# Patient Record
Sex: Female | Born: 1935 | Race: White | Hispanic: No | Marital: Married | Smoking: Never smoker
Health system: Southern US, Community
[De-identification: ages and names within clinical notes are randomized; demographics above are authoritative.]

## PROBLEM LIST (undated history)

## (undated) DIAGNOSIS — I4891 Unspecified atrial fibrillation: Secondary | ICD-10-CM

## (undated) DIAGNOSIS — K649 Unspecified hemorrhoids: Secondary | ICD-10-CM

## (undated) DIAGNOSIS — C801 Malignant (primary) neoplasm, unspecified: Secondary | ICD-10-CM

## (undated) DIAGNOSIS — I639 Cerebral infarction, unspecified: Secondary | ICD-10-CM

## (undated) DIAGNOSIS — I10 Essential (primary) hypertension: Secondary | ICD-10-CM

## (undated) HISTORY — PX: COLONOSCOPY: SHX174

## (undated) HISTORY — PX: HEMORRHOID SURGERY: SHX153

---

## 2001-09-03 ENCOUNTER — Emergency Department (HOSPITAL_COMMUNITY): Admission: EM | Admit: 2001-09-03 | Discharge: 2001-09-03 | Payer: Self-pay | Admitting: *Deleted

## 2004-07-25 ENCOUNTER — Encounter: Admission: RE | Admit: 2004-07-25 | Discharge: 2004-07-25 | Payer: Self-pay | Admitting: Diagnostic Radiology

## 2011-10-02 DIAGNOSIS — I639 Cerebral infarction, unspecified: Secondary | ICD-10-CM

## 2011-10-02 HISTORY — DX: Cerebral infarction, unspecified: I63.9

## 2012-09-03 HISTORY — PX: RECTAL BIOPSY: SHX2303

## 2012-09-04 ENCOUNTER — Other Ambulatory Visit: Payer: Self-pay | Admitting: *Deleted

## 2012-09-04 ENCOUNTER — Other Ambulatory Visit: Payer: Self-pay | Admitting: Gastroenterology

## 2012-09-04 ENCOUNTER — Ambulatory Visit
Admission: RE | Admit: 2012-09-04 | Discharge: 2012-09-04 | Disposition: A | Discharge status: Home / Self Care | Payer: Medicare Other | Source: Ambulatory Visit | Attending: Gastroenterology | Admitting: Gastroenterology

## 2012-09-04 ENCOUNTER — Telehealth: Payer: Self-pay | Admitting: Oncology

## 2012-09-04 DIAGNOSIS — C20 Malignant neoplasm of rectum: Secondary | ICD-10-CM

## 2012-09-04 MED ORDER — IOHEXOL 300 MG/ML  SOLN
100.0000 mL | Freq: Once | INTRAMUSCULAR | Status: AC | PRN
  Administered 2012-09-04: 100 mL via INTRAVENOUS

## 2012-09-04 NOTE — Telephone Encounter (Signed)
PT SCHEDULED PER MD.  °WELCOME PACKET MAILED °

## 2012-09-07 ENCOUNTER — Telehealth: Payer: Self-pay | Admitting: Oncology

## 2012-09-07 NOTE — Telephone Encounter (Signed)
C/D 09/07/12 for appt. 09/14/12

## 2012-09-07 NOTE — Telephone Encounter (Signed)
LMONVM ADVISING THE PT OF HER APPT WITH DR MOODY ON 09/09/2012@4 :00PM FOR RAD ONC

## 2012-09-08 ENCOUNTER — Other Ambulatory Visit: Payer: Self-pay | Admitting: Gastroenterology

## 2012-09-08 NOTE — Addendum Note (Signed)
Addended by: Ji Feldner on: 09/08/2012 05:29 PM   Modules accepted: Orders  

## 2012-09-09 ENCOUNTER — Ambulatory Visit (HOSPITAL_COMMUNITY)
Admission: RE | Admit: 2012-09-09 | Discharge: 2012-09-09 | Disposition: A | Discharge status: Home / Self Care | Payer: Medicare Other | Source: Ambulatory Visit | Attending: Gastroenterology | Admitting: Gastroenterology

## 2012-09-09 ENCOUNTER — Encounter (HOSPITAL_COMMUNITY)
Admission: RE | Disposition: A | Discharge status: Home / Self Care | Payer: Self-pay | Source: Ambulatory Visit | Attending: Gastroenterology

## 2012-09-09 ENCOUNTER — Institutional Professional Consult (permissible substitution): Payer: Medicare Other | Admitting: Radiation Oncology

## 2012-09-09 ENCOUNTER — Encounter (HOSPITAL_COMMUNITY): Payer: Self-pay | Admitting: *Deleted

## 2012-09-09 DIAGNOSIS — C20 Malignant neoplasm of rectum: Secondary | ICD-10-CM | POA: Insufficient documentation

## 2012-09-09 HISTORY — DX: Cerebral infarction, unspecified: I63.9

## 2012-09-09 HISTORY — PX: EUS: SHX5427

## 2012-09-09 HISTORY — DX: Essential (primary) hypertension: I10

## 2012-09-09 HISTORY — DX: Unspecified hemorrhoids: K64.9

## 2012-09-09 SURGERY — ULTRASOUND, LOWER GI TRACT, ENDOSCOPIC
Anesthesia: Moderate Sedation

## 2012-09-09 MED ORDER — FENTANYL CITRATE 0.05 MG/ML IJ SOLN
INTRAMUSCULAR | Status: DC | PRN
  Administered 2012-09-09 (×2): 25 ug via INTRAVENOUS

## 2012-09-09 MED ORDER — MIDAZOLAM HCL 10 MG/2ML IJ SOLN
INTRAMUSCULAR | Status: DC | PRN
  Administered 2012-09-09: 1 mg via INTRAVENOUS
  Administered 2012-09-09 (×2): 2 mg via INTRAVENOUS

## 2012-09-09 MED ORDER — FENTANYL CITRATE 0.05 MG/ML IJ SOLN
INTRAMUSCULAR | Status: AC
  Filled 2012-09-09: qty 4

## 2012-09-09 MED ORDER — SPOT INK MARKER SYRINGE KIT
PACK | SUBMUCOSAL | Status: DC | PRN
  Administered 2012-09-09: 4 mL via SUBMUCOSAL

## 2012-09-09 MED ORDER — SPOT INK MARKER SYRINGE KIT
PACK | SUBMUCOSAL | Status: AC
  Filled 2012-09-09: qty 5

## 2012-09-09 MED ORDER — MIDAZOLAM HCL 10 MG/2ML IJ SOLN
INTRAMUSCULAR | Status: AC
  Filled 2012-09-09: qty 4

## 2012-09-09 MED ORDER — SODIUM CHLORIDE 0.9 % IV SOLN
INTRAVENOUS | Status: DC
  Administered 2012-09-09: 500 mL via INTRAVENOUS

## 2012-09-09 NOTE — Op Note (Addendum)
[facility] [address] [place], [address]   OPERATIVE PROCEDURE REPORT  PATIENT: Breanna Guerrero, Breanna Guerrero  MR#: [id number] BIRTHDATE: [birth year]  GENDER: Female ENDOSCOPIST: Willis Modena, MD REFERRED BY:  Carman Ching, M.D. PROCEDURE DATE:  09/09/2012 PROCEDURE:   Flexible sigmoidoscopy with directed submucosal injection;  endorectal ultrasound ASA CLASS:   Class II INDICATIONS:1.  rectal cancer. MEDICATIONS: Fentanyl 50 mcg IV and Versed 5 mg IV  DESCRIPTION OF PROCEDURE:   After the risks benefits and alternatives of the procedure were thoroughly explained, informed consent was obtained.  Throughout the procedure, the patients blood pressure, pulse and oxygen saturations were monitored continuously. Under direct visualization, the diagnostic upper endoscope and radial  echoendoscope was introduced through the anus  and advanced to the sigmoid colon .  Water was used as necessary to provide an acoustic interface.  Imaging was obtained at 7.5 and . Upon completion of the imaging, water was removed and the patient was sent to the recovery room in satisfactory condition.    FINDINGS:   Firm, fixed mass palpated at tip of examination digit along right anterolateral rectal wall.  Friable, fungating, exophytic, ulcerated mass readily appreciated endoscopically in the mid-to-distal rectum.  Mass about 50% circumferential and extends from 5 to 12 cm from the anal verge. The proximal and distal margins of the tumor were both tattooed via submucosal injection of 4mL of [place] Ink.  Radial echoendoscope then introduced into the rectum, and water was instilled to facilitate acoustic coupling. The mass is seen to penetrate through the muscularis propria in multiple positions.  There are a few small peritumoral lymph nodes appreciated.  The tumor is in very close proximity to parts of the uterus, but I don't detect any frank uterine invasion.  STAGING: T3 N1  Mx  ENDOSCOPIC IMPRESSION:  Mid-to-distal rectal cancer, staging as above.  RECOMMENDATIONS:  1.  Watch for potential complications of procedure. 2.  Consult with Dr. Truett Perna planned in near future; based on today's results, patient will likely need neoadjuvant chemotherapy and radiation prior to consideration of surgical resection. 3.  Will discuss with Dr. Randa Evens.   _______________________________ Rosalie DoctorWillis Modena, MD 09/09/2012 4:02 PM Revised: 09/09/2012 4:02 PM CC:

## 2012-09-09 NOTE — H&P (Signed)
Patient interval history reviewed.  Patient examined again.  There has been no change from documented H/P dated 09/02/12 (scanned into chart from our office) except as documented above.  Assessment:  1. Rectal cancer.  Plan:  1.  Endorectal ultrasound for locoregional staging. 2.  Risks (bleeding, infection, bowel perforation that could require surgery, sedation-related changes in cardiopulmonary systems), benefits (identification and possible treatment of source of symptoms, exclusion of certain causes of symptoms), and alternatives (watchful waiting, radiographic imaging studies, empiric medical treatment) of endorectal ultrasound were explained to patient/family in detail and patient wishes to proceed.

## 2012-09-10 ENCOUNTER — Encounter (HOSPITAL_COMMUNITY): Payer: Self-pay | Admitting: Gastroenterology

## 2012-09-11 ENCOUNTER — Ambulatory Visit
Admission: RE | Admit: 2012-09-11 | Discharge: 2012-09-11 | Disposition: A | Discharge status: Home / Self Care | Payer: Medicare Other | Source: Ambulatory Visit | Attending: Radiation Oncology | Admitting: Radiation Oncology

## 2012-09-11 ENCOUNTER — Encounter: Payer: Self-pay | Admitting: Radiation Oncology

## 2012-09-11 VITALS — BP 108/52 | HR 73 | Temp 98.0°F | Wt 124.2 lb

## 2012-09-11 DIAGNOSIS — C20 Malignant neoplasm of rectum: Secondary | ICD-10-CM

## 2012-09-11 HISTORY — DX: Malignant (primary) neoplasm, unspecified: C80.1

## 2012-09-11 NOTE — Progress Notes (Signed)
Patient and daughter here for consultation of newly diagnosed cancer of rectum.Patient presented with pain and bleeding on defecation a few months ago but has a delay in medical treatment as 2 sisters had been diagnosed with cancer in the last 6 months.Daughter is spouse of Dr.Reid Occupational psychologist).Patient lives with spouse in [place] but will live here with daughter until completion of treatment.Scheduled for consultation with Dr.Sherrill on Monday 09/14/12.Patient has been relatively healthy except for this diagnosis.No bleeding now but does have painful bowel movements.Only takes advil as needed or pain.

## 2012-09-11 NOTE — Progress Notes (Signed)
Please see the Nurse Progress Note in the MD Initial Consult Encounter for this patient. 

## 2012-09-14 ENCOUNTER — Ambulatory Visit
Admission: RE | Admit: 2012-09-14 | Discharge: 2012-09-14 | Disposition: A | Discharge status: Home / Self Care | Payer: Medicare Other | Source: Ambulatory Visit | Attending: Radiation Oncology | Admitting: Radiation Oncology

## 2012-09-14 ENCOUNTER — Ambulatory Visit: Payer: Medicare Other

## 2012-09-14 ENCOUNTER — Encounter: Payer: Self-pay | Admitting: Oncology

## 2012-09-14 ENCOUNTER — Telehealth: Payer: Self-pay | Admitting: *Deleted

## 2012-09-14 ENCOUNTER — Ambulatory Visit (HOSPITAL_BASED_OUTPATIENT_CLINIC_OR_DEPARTMENT_OTHER): Payer: Medicare Other | Admitting: Oncology

## 2012-09-14 ENCOUNTER — Telehealth: Payer: Self-pay | Admitting: Radiation Oncology

## 2012-09-14 ENCOUNTER — Telehealth: Payer: Self-pay | Admitting: Oncology

## 2012-09-14 VITALS — BP 129/75 | HR 83 | Temp 98.1°F | Resp 18 | Ht 63.0 in | Wt 123.3 lb

## 2012-09-14 DIAGNOSIS — C20 Malignant neoplasm of rectum: Secondary | ICD-10-CM

## 2012-09-14 DIAGNOSIS — R197 Diarrhea, unspecified: Secondary | ICD-10-CM | POA: Insufficient documentation

## 2012-09-14 DIAGNOSIS — K625 Hemorrhage of anus and rectum: Secondary | ICD-10-CM

## 2012-09-14 DIAGNOSIS — Z79899 Other long term (current) drug therapy: Secondary | ICD-10-CM | POA: Insufficient documentation

## 2012-09-14 DIAGNOSIS — Z510 Encounter for antineoplastic radiation therapy: Secondary | ICD-10-CM | POA: Insufficient documentation

## 2012-09-14 DIAGNOSIS — K5909 Other constipation: Secondary | ICD-10-CM

## 2012-09-14 DIAGNOSIS — Y842 Radiological procedure and radiotherapy as the cause of abnormal reaction of the patient, or of later complication, without mention of misadventure at the time of the procedure: Secondary | ICD-10-CM | POA: Insufficient documentation

## 2012-09-14 DIAGNOSIS — R21 Rash and other nonspecific skin eruption: Secondary | ICD-10-CM | POA: Insufficient documentation

## 2012-09-14 NOTE — Telephone Encounter (Signed)
gv pt appt schedule for April and May. CHED schedule for next wk per pt she does not want to come back this week.

## 2012-09-14 NOTE — Progress Notes (Addendum)
Radiation Oncology         [phone] ________________________________  Name: Breanna Guerrero MRN: [id number]  Date: 09/11/2012  DOB: February 19, 1928  CC:No PCP Per Patient  Ladene Artist, MD   Willis Modena, MD  REFERRING PHYSICIAN: Ladene Artist, MD   DIAGNOSIS: rectal cancer  HISTORY OF PRESENT ILLNESS::Breanna Guerrero is a 79 y.o. female who is seen for an initial consultation visit. The patient indicates that she noticed a small amount of blood with her bowel movements approximately 4 months ago. She feels that her workup of this issue was somewhat delayed because she has had some significant health problems and her family including 2 sisters who have been diagnosed with cancer. She has also developed some pain which is present after bowel movements. Otherwise no significant pain. She has been taking Advil for this with some relief. The patient also describes some constipation and has a history of taking some prune juice for this. As may have worsened some recently.  The patient has undergone flexible sigmoidoscopy with an endorectal ultrasound. I don't have additional records including any additional initial workup but we will request this. This was performed by Dr. Dulce Sellar. A mass was seen extending from 5-12 cm from the anal verge. The proximal and distal margins have been tattooed. The mass was present about 50% circumferentially. Some small peritumol lymph nodes were present. Assuming malignancy, this was staged as T3 N1 disease. The final pathology revealed findings indicative of high-grade dysplasia and a small focus suspicious for adenocarcinoma.   The patient underwent a CT scan of the chest abdomen and pelvis. No evidence of metastatic disease within the chest. There was some circumferential wall thickening within the rectum, near the rectosigmoid junction. Some small nearby lymph nodes were also present.  I have been asked to see the patient for consideration of radiotherapy and the  patient is scheduled to see medical oncology early next week.  PREVIOUS RADIATION THERAPY: No   PAST MEDICAL HISTORY:  has a past medical history of Hypertension; Stroke (May 2013); Hemorrhoids; and Cancer.     PAST SURGICAL HISTORY: Past Surgical History  Procedure Laterality Date  . Hemorrhoid surgery    . Colonoscopy    . Eus N/A 09/09/2012    Procedure: LOWER ENDOSCOPIC ULTRASOUND (EUS);  Surgeon: Willis Modena, MD;  Location: [facility];  Service: Endoscopy;  Laterality: N/A;  moderate sedation  . Rectal biopsy  09/03/2012    Suspicious Adenocarcinoma     FAMILY HISTORY: family history includes Brain cancer in her sister; Breast cancer in her sister; and Leukemia in her maternal aunt.   SOCIAL HISTORY:  reports that she has never smoked. She does not have any smokeless tobacco history on file. She reports that she does not drink alcohol or use illicit drugs.   ALLERGIES: Latex; Penicillins; and Sulfa antibiotics   MEDICATIONS:  Current Outpatient Prescriptions  Medication Sig Dispense Refill  . ibuprofen (ADVIL,MOTRIN) 200 MG tablet Take 200 mg by mouth every 6 (six) hours as needed for pain.      . metoprolol (LOPRESSOR) 50 MG tablet Take 50 mg by mouth daily.      Marland Kitchen NIFEdipine (PROCARDIA-XL/ADALAT-CC/NIFEDICAL-XL) 30 MG 24 hr tablet Take 30 mg by mouth daily.       No current facility-administered medications for this encounter.     REVIEW OF SYSTEMS:  A 15 point review of systems is documented in the electronic medical record. This was obtained by the nursing staff. However, I  reviewed this with the patient to discuss relevant findings and make appropriate changes.  Pertinent items are noted in HPI.    PHYSICAL EXAM:  weight is 124 lb 3.2 oz (56.337 kg). Her temperature is 98 F (36.7 C). Her blood pressure is 108/52 and her pulse is 73. Her oxygen saturation is 98%.   General: Well-developed, in no acute distress HEENT: Normocephalic, atraumatic; oral cavity  clear Neck: Supple without any lymphadenopathy Cardiovascular: Regular rate and rhythm Respiratory: Clear to auscultation bilaterally GI: Soft, nontender, normal bowel sounds Extremities: No edema present Neuro: No focal deficits Lymph: No inguinal adenopathy present Rectal: No external findings. Distal edge of tumor felt on the right anterolaterally in approximately 4-5 cm from the anal verge    LABORATORY DATA:  No results found for this basename: WBC, HGB, HCT, MCV, PLT   No results found for this basename: NA, K, CL, CO2   No results found for this basename: ALT, AST, GGT, ALKPHOS, BILITOT      RADIOGRAPHY: Ct Chest W Contrast  09/04/2012  *RADIOLOGY REPORT*  Clinical Data:  Rectal cancer discovered on colonoscopy yesterday. Staging.  CT CHEST, ABDOMEN AND PELVIS WITH CONTRAST  Technique:  Multidetector CT imaging of the chest, abdomen and pelvis was performed following the standard protocol during bolus administration of intravenous contrast.  Contrast: OMNIPAQUE IOHEXOL 300 MG/ML  SOLN  Comparison:   None.  CT CHEST  Findings:  There are no enlarged mediastinal or hilar lymph nodes. There is minimal atherosclerosis for age.  No significant pleural or pericardial effusion is present.  There is mild right apical scarring.  The lungs are otherwise clear.  There are no suspicious pulmonary nodules.  There are no worrisome osseous findings.  IMPRESSION: Negative chest CT.  No evidence of metastatic disease.  CT ABDOMEN AND PELVIS  Findings:  The liver appears normal, without evidence of metastatic disease.  There is minimal extrahepatic biliary dilatation.  The gallbladder and pancreas appear normal.  The spleen appears normal. There is minimal prominence of both adrenal glands without focal nodularity.  There is a small nonobstructing mid right renal calculus.  There are tiny low density renal lesions bilaterally. There is no hydronephrosis.  The stomach and small bowel appear  unremarkable.  The colon is redundant and stool-filled.  There are moderate diverticular changes of the sigmoid colon.  There is irregular circumferential wall thickening of the rectosigmoid junction.  There are several mildly enlarged perirectal lymph nodes, most notably on the left on images 83 and 84.  In addition, there are small perirectal varices. There is no evidence of bowel obstruction, perforation or abscess. There are no enlarged abdominal lymph nodes.  There is no ascites or peritoneal nodularity.  The uterus is retroverted, abutting the rectosigmoid.  The bladder appears normal.  Mild lumbar spine degenerative changes are noted.  There are no worrisome osseous findings.  IMPRESSION:  1.  Ill-defined circumferential thickening of the walls of the rectosigmoid colon consistent with given history of colon cancer. There are prominent perirectal lymph nodes concerning for local extension of tumor. 2.  No distant metastases identified. 3.  No evidence of bowel obstruction or perforation. Moderate sigmoid diverticulosis. 4.  Nonobstructing right renal calculus.   Original Report Authenticated By: Carey Bullocks, M.D.    Ct Abdomen Pelvis W Contrast  09/04/2012  *RADIOLOGY REPORT*  Clinical Data:  Rectal cancer discovered on colonoscopy yesterday. Staging.  CT CHEST, ABDOMEN AND PELVIS WITH CONTRAST  Technique:  Multidetector CT imaging  of the chest, abdomen and pelvis was performed following the standard protocol during bolus administration of intravenous contrast.  Contrast: OMNIPAQUE IOHEXOL 300 MG/ML  SOLN  Comparison:   None.  CT CHEST  Findings:  There are no enlarged mediastinal or hilar lymph nodes. There is minimal atherosclerosis for age.  No significant pleural or pericardial effusion is present.  There is mild right apical scarring.  The lungs are otherwise clear.  There are no suspicious pulmonary nodules.  There are no worrisome osseous findings.  IMPRESSION: Negative chest CT.  No  evidence of metastatic disease.  CT ABDOMEN AND PELVIS  Findings:  The liver appears normal, without evidence of metastatic disease.  There is minimal extrahepatic biliary dilatation.  The gallbladder and pancreas appear normal.  The spleen appears normal. There is minimal prominence of both adrenal glands without focal nodularity.  There is a small nonobstructing mid right renal calculus.  There are tiny low density renal lesions bilaterally. There is no hydronephrosis.  The stomach and small bowel appear unremarkable.  The colon is redundant and stool-filled.  There are moderate diverticular changes of the sigmoid colon.  There is irregular circumferential wall thickening of the rectosigmoid junction.  There are several mildly enlarged perirectal lymph nodes, most notably on the left on images 83 and 84.  In addition, there are small perirectal varices. There is no evidence of bowel obstruction, perforation or abscess. There are no enlarged abdominal lymph nodes.  There is no ascites or peritoneal nodularity.  The uterus is retroverted, abutting the rectosigmoid.  The bladder appears normal.  Mild lumbar spine degenerative changes are noted.  There are no worrisome osseous findings.  IMPRESSION:  1.  Ill-defined circumferential thickening of the walls of the rectosigmoid colon consistent with given history of colon cancer. There are prominent perirectal lymph nodes concerning for local extension of tumor. 2.  No distant metastases identified. 3.  No evidence of bowel obstruction or perforation. Moderate sigmoid diverticulosis. 4.  Nonobstructing right renal calculus.   Original Report Authenticated By: Carey Bullocks, M.D.        IMPRESSION:  the patient is presenting with T3, N1, M0 adenocarcinoma of the mid to proximal rectum clinically. The patient I believe is a good candidate to proceed with neoadjuvant chemoradiotherapy followed by surgical resection. Given the ultrasound findings of locally advanced  disease, which were consistent with the CT scan findings as well, I am comfortable with the diagnosis of adenocarcinoma based on the pathology report and overall clinical picture. The patient is anxious regarding possible chemotherapy, and will further discuss this issue with Dr. Truett Perna.  I discussed with the patient the details of a 5-1/2 week course of radiotherapy. We discussed the rationale of this treatment in detail. We discussed the expected benefits of such treatment and she did have a number of good questions. We also discussed the potential side effects and risks of treatment as well. To some degree, we discussed the role of both chemotherapy therapy as well as surgery as part of her overall treatment plan.   PLAN:  The patient does wish to proceed with this plan and we have scheduled her for simulation. She is scheduled to see Dr. Truett Perna early next week. She has requested that I place a referral for her to see Dr. Maisie Fus as well in surgery.     I spent 60 minutes minutes face to face with the patient and more than 50% of that time was spent in counseling and/or coordination  of care.    ________________________________   Radene Gunning, MD, PhD

## 2012-09-14 NOTE — Addendum Note (Signed)
Encounter addended by: Jonna Coup, MD on: 09/14/2012 12:39 PM<BR>     Documentation filed: Notes Section

## 2012-09-14 NOTE — Telephone Encounter (Signed)
Met w patient to discuss RO billing. Pt had no financial concerns today.  Dx: Rectal cancer - Primary 154.1   Attending Rad: JM   Rad Tx:  Daily

## 2012-09-14 NOTE — Progress Notes (Signed)
[facility] New Patient Consult   Referring Guerrero: Breanna Guerrero 79 y.o.  [birth year]    Reason for Referral: Rectal cancer     HPI: She developed constipation and intermittent rectal bleeding after starting nifedipine for hypertension. She saw her primary physician and was referred to Breanna Guerrero for a colonoscopy. On 09/03/2012 she was taken to a colonoscopy procedure. A mass was palpated on digital exam at 9 cm from the anal verge. A fungating polypoid nonobstructing mass was found in the rectum. Diverticulosis in the sigmoid colon. The exam was otherwise normal. The pathology from the rectum biopsy confirmed fragments of a tubulovillous adenoma with high-grade glandular dysplasia and a small focus suspicious for adenocarcinoma.  She was referred for CTs of the chest, abdomen, and pelvis on 09/04/2012. No suspicious pulmonary nodules. The liver appeared normal. A small nonobstructing mid right renal calculus was noted. Irregular circumferential wall thickening was noted at the rectosigmoid junction. Several mildly enlarged perirectal lymph nodes were seen. No enlarged abdominal lymph nodes. No ascites. A peritoneal nodularity.  She was taken to an endoscopic ultrasound procedure by Dr. Dulce Guerrero on 09/09/2012. A friable ulcerated mass was noted in the mid to distal rectum extending from 5-12 cm from the anal verge. The mass was tattooed. The mass was seen to penetrate the muscularis propria in multiple positions. A few small peritumoral lymph nodes were seen. The tumor was noted to be in close proximity to parts of the uterus but uterine invasion was not seen. The tumor was staged as an uT3N1 lesion.  She saw Dr. Mitzi Guerrero and is scheduled to begin neoadjuvant radiation on 09/22/2012.    Past Medical History  Diagnosis Date  . Hypertension   . Stroke May 2013    mild stroke  . Hemorrhoids   . Cancer  April 2014     Rectal adenocarcinoma   .    G2 P2  Past  Surgical History  Procedure Laterality Date  . Hemorrhoid surgery    . Colonoscopy   09/03/2012   . Eus N/A 09/09/2012    Procedure: LOWER ENDOSCOPIC ULTRASOUND (EUS);  Surgeon: Breanna Guerrero;  Location: [facility];  Service: Endoscopy;  Laterality: N/A;  moderate sedation  . Rectal biopsy  09/03/2012    Suspicious Adenocarcinoma    Family History  Problem Relation Age of Onset  . Breast cancer Sister   . Brain cancer Sister  43   . Leukemia Maternal Aunt     Current outpatient prescriptions:ibuprofen (ADVIL,MOTRIN) 200 MG tablet, Take 200 mg by mouth every 6 (six) hours as needed for pain., Disp: , Rfl: ;  metoprolol (LOPRESSOR) 50 MG tablet, Take 50 mg by mouth daily., Disp: , Rfl: ;  NIFEdipine (PROCARDIA-XL/ADALAT-CC/NIFEDICAL-XL) 30 MG 24 hr tablet, Take 30 mg by mouth daily., Disp: , Rfl:   Allergies:  Allergies  Allergen Reactions  . Latex   . Penicillins   . Sulfa Antibiotics     Social History: She is a retired Psychologist, forensic. She lives with her husband in [place]. No tobacco or alcohol use. No transfusion history. No risk factor for HIV or hepatitis.  ROS:   Positives include: Constipation, rectal bleeding  A complete ROS was otherwise negative.  Physical Exam:  Blood pressure 129/75, pulse 83, temperature 98.1 F (36.7 C), temperature source Oral, resp. rate 18, height 5\' 3"  (1.6 m), weight 123 lb 4.8 oz (55.929 kg).  HEENT: Oropharynx without visible mass, neck without mass  Lungs: Clear bilaterally Cardiac: Regular rate and rhythm Abdomen: No hepatosplenomegaly, nontender, no mass  Vascular: No leg edema Lymph nodes: No cervical, supraclavicular, axillary, or inguinal nodes Neurologic: Alert and oriented, the motor exam appears intact in the upper and lower extremities Skin: No rash Musculoskeletal: No spine tenderness   LAB: 09/02/2012  CBC-hemoglobin 13.6, MCV 87.2, platelets 308,000, white count 7.9, ANC 5.0   CMP-creatinine 0.74,  bilirubin 0.5, CEA 7.8     Radiology: As per history of present illness    Assessment/Plan:   1. Rectal cancer-mid to distal rectal mass, status post an endoscopic biopsy 09/03/2012 with the pathology confirming fragments of a tubulovillous adenoma with a small focus suspicious for adenocarcinoma, staging by EUS-T3N1  2. Constipation/rectal bleeding secondary to #1  3. History of a CVA-May of 2013 with no residual deficit   Disposition:   The clinical history, CT, physical exam findings, and endoscopic ultrasound are consistent with a diagnosis of invasive rectal cancer. I discussed the diagnosis and treatment options with Breanna Guerrero and her daughter. We discussed the rationale for neoadjuvant chemotherapy and radiation. She understands the majority of patients with rectal cancer are treated with neoadjuvant therapy.  She is scheduled begin radiation 09/22/2012. I recommend concurrent capecitabine. We discussed the potential toxicities associated with capecitabine including the chance for nausea, mucositis, diarrhea, and hematologic toxicity. We also discussed the skin rash, hyperpigmentation, and hand/foot syndrome associated with capecitabine. We discussed the potential for skin breakdown at the groin and perineum.  She understands the likely need for a temporary ileostomy if she undergoes surgery following chemotherapy/radiation. She wonders whether she may be able to avoid an ileostomy if she undergoes upfront surgery. She is scheduled to see Dr. Maisie Guerrero tomorrow and her case will be presented at the GI tumor conference on 09/16/2012.  We will plan for neoadjuvant therapy unless Dr. Maisie Guerrero decides to proceed with surgery.  Approximately 60 minutes were spent with patient today. The majority of the time was spent in counseling/coordination of care.  Breanna Guerrero 09/14/2012, 6:22 PM

## 2012-09-14 NOTE — Addendum Note (Signed)
Encounter addended by: Jonna Coup, MD on: 09/14/2012  1:05 PM<BR>     Documentation filed: Visit Diagnoses, Notes Section

## 2012-09-14 NOTE — Telephone Encounter (Signed)
Called patient to inform of an appt. To see Dr. Maisie Fus at [facility] - arrival time - 9:10 am on 09-15-12, address- [address] lvm for a return call

## 2012-09-14 NOTE — Progress Notes (Signed)
Checked in new patient for dr visit only.

## 2012-09-15 ENCOUNTER — Telehealth: Payer: Self-pay | Admitting: *Deleted

## 2012-09-15 ENCOUNTER — Other Ambulatory Visit: Payer: Self-pay | Admitting: *Deleted

## 2012-09-15 ENCOUNTER — Ambulatory Visit: Payer: Medicare Other

## 2012-09-15 ENCOUNTER — Ambulatory Visit (INDEPENDENT_AMBULATORY_CARE_PROVIDER_SITE_OTHER): Payer: Federal, State, Local not specified - PPO | Admitting: General Surgery

## 2012-09-15 ENCOUNTER — Encounter (INDEPENDENT_AMBULATORY_CARE_PROVIDER_SITE_OTHER): Payer: Self-pay | Admitting: General Surgery

## 2012-09-15 ENCOUNTER — Encounter: Payer: Self-pay | Admitting: Oncology

## 2012-09-15 VITALS — BP 100/72 | HR 90 | Temp 98.1°F | Resp 18 | Ht 62.5 in | Wt 123.0 lb

## 2012-09-15 DIAGNOSIS — C20 Malignant neoplasm of rectum: Secondary | ICD-10-CM

## 2012-09-15 MED ORDER — CAPECITABINE 500 MG PO TABS: ORAL_TABLET | ORAL | Status: DC

## 2012-09-15 NOTE — Progress Notes (Signed)
Faxed xeloda prescription to [facility] °

## 2012-09-15 NOTE — Patient Instructions (Signed)
Colorectal Cancer  Colorectal cancer is the second most common cancer in the [place], striking 140,000 people annually and causing 60,000 deaths. That's a staggering figure when you consider the disease is potentially curable if diagnosed in the early stages. Who is at risk? Though colorectal cancer may occur at any age, more than 90% of the patients are over age 77, at which point the risk doubles every ten years. In addition to age, other high risk factors include a family history of colorectal cancer and polyps and a personal history of ulcerative colitis, colon polyps or cancer of other organs, especially of the breast or uterus. How does it start? It is generally agreed that nearly all colon and rectal cancer begins in benign polyps. These pre-malignant growths occur on the bowel wall and may eventually increase in size and become cancer. Removal of benign polyps is one aspect of preventive medicine that really works! What are the symptoms? The most common symptoms are rectal bleeding and changes in bowel habits, such as constipation or diarrhea. (These symptoms are also common in other diseases so it is important you receive a thorough examination should you experience them.) Abdominal pain and weight loss are usually late symptoms indicating possible extensive disease. Unfortunately, many polyps and early cancers fail to produce symptoms. Therefore, it is important that your routine physical includes colorectal cancer detection procedures once you reach age 50.  There are several methods for detection of colorectal cancer. These include digital rectal examination, a chemical test of the stool for blood, flexible sigmoidoscopy and colonoscopy (lighted tubular instruments used to inspect the lower bowel) and barium enema. Be sure to discuss these options with your surgeon to determine which procedure is best for you. Individuals who have a first-degree relative (parent or sibling) with colon  cancer or polyps should start their colon cancer screening at the age of 77. How is colorectal cancer treated? Colorectal cancer requires surgery in nearly all cases for complete cure. Radiation and chemotherapy are sometimes used in addition to surgery. Between 80-90% are restored to normal health if the cancer is detected and treated in the earliest stages. The cure rate drops to 50% or less when diagnosed in the later stages. Thanks to modern technology, less than 5% of all colorectal cancer patients require a colostomy, the surgical construction of an artificial excretory opening from the colon. Can colon cancer be prevented? Colon cancer is very preventable. The most important step towards preventing colon cancer is getting a screening test. Any abnormal screening test should be followed by a colonoscopy. Some individuals prefer to start with colonoscopy as a screening test. Colonoscopy provides a detailed examination of the bowel. Polyps can be identified and can often be removed during colonoscopy. Though not definitely proven, there is some evidence that diet may play a significant role in preventing colorectal cancer. As far as we know, a high fiber, low fat diet is the only dietary measure that might help prevent colorectal cancer. Finally, pay attention to changes in your bowel habits. Any new changes such as persistent constipation, diarrhea, or blood in the stool should be discussed with your physician.   Can hemorrhoids lead to colon cancer? No, but hemorrhoids may produce symptoms similar to colon polyps or cancer. Should you experience these symptoms, you should have them examined and evaluated by a physician, preferably by a colon and rectal surgeon. What is a colon and rectal surgeon? Colon and rectal surgeons are experts in the surgical and non-surgical treatment   of diseases of the colon, rectum and anus. They have completed advanced surgical training in the treatment of these  diseases as well as full general surgical training. Board-certified colon and rectal surgeons complete residencies in general surgery and colon and rectal surgery, and pass intensive examinations conducted by the American Board of Surgery and the American Board of Colon and Rectal Surgery. They are well-versed in the treatment of both benign and malignant diseases of the colon, rectum and anus and are able to perform routine screening examinations and surgically treat conditions if indicated to do so.  2012 American Society of Colon & Rectal Surgeons  

## 2012-09-15 NOTE — Telephone Encounter (Signed)
Xeloda script given to [facility] 09/15/12.

## 2012-09-15 NOTE — Progress Notes (Signed)
  Radiation Oncology         [phone] ________________________________  Name: Breanna Guerrero MRN: [id number]  Date: 09/14/2012  DOB: 03-13-1928  SIMULATION AND TREATMENT PLANNING NOTE  The patient presented for simulation for the patient's upcoming course of preoperative radiation for the diagnosis of rectal cancer. The patient was placed in a supine position. A customized alpha cradle was constructed toaid in patient immobilization. This complex treatment device will be used on a daily basis during the treatment. In this fashion a CT scan was obtained through the pelvic region and the isocenter was placed near midline within the pelvis.  The patient will initially be planned to receive a course of radiation to a dose of 45 gray. This will be accomplished in 25 fractions at 1.8 gray per fraction. This initial treatment will correspond to a 3-D conformal technique. The gross tumor volume has been contoured in addition to the rectum, bladder and femoral heads. DVH's of each of these structures have been requested and these will be carefully reviewed as part of the 3-D conformal treatment planning process. To accomplish this initial treatment, 4 customized blocks have been designed for this purpose. Each of these 4 complex treatment devices will be used on a daily basis during the initial course of his treatment. It is anticipated that the patient will then receive a boost for an additional 5.4 gray. The anticipated total dose therefore will be 50.4 gray.  Special treatment procedure The patient will receive chemotherapy during the course of radiation treatment. The patient may experience increased or overlapping toxicity due to this combined-modality approach and the patient will be monitored for such problems. This may include extra lab work as necessary. This therefore constitutes a special treatment procedure.    ________________________________  Radene Gunning, MD, PhD

## 2012-09-15 NOTE — Progress Notes (Signed)
Chief Complaint  Patient presents with  . Rectal Cancer    HISTORY: Breanna Guerrero is a 79 y.o. female who presents to the office with a new diagnosis of rectal cancer.  This was found on colonoscopy.  She developed some new bleeding and constipation, which prompted the colonoscopy.  Workup thus far has included CT chest, abdomen and pelvis, which show no signs of metastatic disease.  An [place] was completed by Dr Dulce Sellar, which showed a T3N1 tumor.  This was tattooed proximally and distally.   Biopsies show HGD dysplasia and a small focus of tumor suspicious for adenoca.  She has had a 5 lb weight loss over the last year.  She does have some cramping with BM's.  She has an active lifestyle and still works at her business part time.  She can walk up a flight of stairs without difficulty.  Past Medical History  Diagnosis Date  . Hypertension   . Stroke May 2013    mild stroke  . Hemorrhoids   . Cancer     Rectal adenocarcinoma      Past Surgical History  Procedure Laterality Date  . Hemorrhoid surgery    . Colonoscopy    . Eus N/A 09/09/2012    Procedure: LOWER ENDOSCOPIC ULTRASOUND (EUS);  Surgeon: Willis Modena, MD;  Location: [facility];  Service: Endoscopy;  Laterality: N/A;  moderate sedation  . 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 ibuprofen (ADVIL,MOTRIN) 200 MG tablet Take 200 mg by mouth every 6 (six) hours as needed for pain.      . metoprolol (LOPRESSOR) 50 MG tablet Take 50 mg by mouth daily.      Marland Kitchen NIFEdipine (PROCARDIA-XL/ADALAT-CC/NIFEDICAL-XL) 30 MG 24 hr tablet Take 30 mg by mouth daily.       No current facility-administered medications for this visit.      Allergies  Allergen Reactions  . Latex   . Penicillins   . Sulfa Antibiotics       Family History  Problem Relation Age of Onset  . Breast cancer Sister   . Brain cancer Sister   . Leukemia Maternal Aunt        History   Social History  . Marital Status: Married    Spouse Name: N/A    Number of Children: 2  . Years of Education: N/A   Occupational History  .     Social History Main Topics  . Smoking status: Never Smoker   . Smokeless tobacco: None  . Alcohol Use: No  . Drug Use: No  . Sexually Active: None   Other Topics Concern  . None   Social History Narrative   Married   1 daughter here in [place] and 1 son in [place]   Resident of [place] but staying with daughter for treatment   Parents had no history of cancer       REVIEW OF SYSTEMS - PERTINENT POSITIVES ONLY: Review of Systems - General ROS: negative for - chills, fever or weight loss Respiratory ROS: no cough, shortness of breath, or wheezing Cardiovascular ROS: no chest pain or dyspnea on exertion Gastrointestinal ROS: no abdominal pain, change in bowel habits, or black or bloody stools Genito-Urinary ROS: no dysuria, trouble voiding, or hematuria  EXAM: Filed Vitals:   09/15/12 0959  BP: 100/72  Pulse: 90  Temp: 98.1 F (36.7 C)  Resp: 18  Gen:  No acute distress.  Well nourished Neurological: Alert and oriented to person, place, and time. Coordination normal.  Head: Normocephalic and atraumatic.  Eyes: Conjunctivae are normal. Pupils are equal, round, and reactive to light. No scleral icterus.  Neck: Normal range of motion. Neck supple. No tracheal deviation or thyromegaly present.  No cervical lymphadenopathy. Cardiovascular: Normal rate, regular rhythm, normal heart sounds and intact distal pulses.  Exam reveals no gallop and no friction rub.  No murmur heard. Respiratory: Effort normal.  No respiratory distress. No chest wall tenderness. Breath sounds normal.  No wheezes, rales or rhonchi.  GI: Soft. Bowel sounds are normal. The abdomen is soft and nontender.  There is no rebound and no guarding.  Musculoskeletal: Normal range of motion. Extremities are nontender.  Skin: Skin is warm and dry.  No rash noted. No diaphoresis. No erythema. No pallor. No clubbing, cyanosis, or edema.   Psychiatric: Normal mood and affect. Behavior is normal. Judgment and thought content normal.   Anorectal Exam: mass at 4-5 cm from anal verge   LABORATORY RESULTS: Available labs are reviewed  CEA: unknown  RADIOLOGY RESULTS:   Images and reports are reviewed. CT Chest, Abd and Pelvis show no signs of distal metastatic disease    ASSESSMENT AND PLAN: Breanna Guerrero is a 79 y.o. F with rectal cancer confirmed by EUS.  Given the [place] staging,  Ithink she would benefit from neoadjuvant therapy. This is a relatively low tumor and would require a LAR with diverting ileostomy or an LAR and permanent colostomy.  The former option may be a problem given her age, and her ileostomy would need to monitored to keep her from getting dehydrated.  We discussed other options but I told her that this option would give her the best chance at cure.  She would be at slightly higher cardiovascular risk for the surgery, but I do not think this would be prohibitive.  We discussed low anterior syndrome in depth.  I told her that the permanent colostomy may be better for her quality of life, but I will leave the final decision up to her.  I will see her back in about 2 months.  We discussed her constipation, and I recommended that she continue the miralax and eat a low fiber diet until her BM's become easier to pass.     Vanita Panda, MD Colon and Rectal Surgery / General Surgery [facility]      Visit Diagnoses: 1. Rectal cancer     Primary Care Physician: No PCP Per Patient

## 2012-09-15 NOTE — Telephone Encounter (Signed)
Spoke with patient's daughter, [name], per request of Dr. Truett Perna.  Patient to start Xeloda with radiation as discussed on 09/14/12.  Patient's daughter stated that the Xeloda, if mailed by a pharmacy, should be delivered to her home at : [address], [place] [address].  She stated to make sure the pharmaceutical company calls her regarding any questions as her mother is going to be living with her during treatment.  This RN informed Mrs. Azucena Kuba to contact this office if she has not heard anything on the statis of the Xeloda by 09/18/12.  She verbalized understanding.  This information was given to [facility] who will notify the distributor of Xeloda.

## 2012-09-16 ENCOUNTER — Ambulatory Visit: Payer: Medicare Other

## 2012-09-17 ENCOUNTER — Ambulatory Visit: Payer: Medicare Other

## 2012-09-17 NOTE — Telephone Encounter (Signed)
RECEIVED A FAX FROM [employer] CONCERNING A CONFIRMATION OF PRESCRIPTION SHIPMENT FOR CAPECITABINE ON 09/16/12. 

## 2012-09-18 ENCOUNTER — Ambulatory Visit: Payer: Medicare Other

## 2012-09-21 ENCOUNTER — Other Ambulatory Visit: Payer: Medicare Other

## 2012-09-21 ENCOUNTER — Other Ambulatory Visit: Payer: Self-pay | Admitting: *Deleted

## 2012-09-21 ENCOUNTER — Ambulatory Visit
Admission: RE | Admit: 2012-09-21 | Discharge: 2012-09-21 | Disposition: A | Discharge status: Home / Self Care | Payer: Medicare Other | Source: Ambulatory Visit | Attending: Radiation Oncology | Admitting: Radiation Oncology

## 2012-09-21 ENCOUNTER — Ambulatory Visit: Payer: Medicare Other

## 2012-09-21 DIAGNOSIS — C20 Malignant neoplasm of rectum: Secondary | ICD-10-CM

## 2012-09-21 MED ORDER — PROCHLORPERAZINE MALEATE 10 MG PO TABS: 10.0000 mg | ORAL_TABLET | Freq: Four times a day (QID) | ORAL | Status: DC | PRN

## 2012-09-22 ENCOUNTER — Ambulatory Visit
Admission: RE | Admit: 2012-09-22 | Discharge: 2012-09-22 | Disposition: A | Discharge status: Home / Self Care | Payer: Medicare Other | Source: Ambulatory Visit | Attending: Radiation Oncology | Admitting: Radiation Oncology

## 2012-09-22 ENCOUNTER — Ambulatory Visit: Payer: Medicare Other

## 2012-09-22 NOTE — Progress Notes (Signed)
  Radiation Oncology         [phone]) [phone] ________________________________  Name: Breanna Guerrero MRN: [id number]  Date: 09/21/2012  DOB: [birth year]  Simulation Verification Note   NARRATIVE: The patient was brought to the treatment unit and placed in the planned treatment position. The clinical setup was verified. Then port films were obtained and uploaded to the radiation oncology medical record software.  The treatment beams were carefully compared against the planned radiation fields. The position, location, and shape of the radiation fields was reviewed. The targeted volume of tissue appears to be appropriately covered by the radiation beams. Based on my personal review, I approved the simulation verification. The patient's treatment will proceed as planned.  ________________________________   Radene Gunning, MD, PhD

## 2012-09-23 ENCOUNTER — Ambulatory Visit: Payer: Medicare Other

## 2012-09-23 ENCOUNTER — Ambulatory Visit
Admission: RE | Admit: 2012-09-23 | Discharge: 2012-09-23 | Disposition: A | Discharge status: Home / Self Care | Payer: Medicare Other | Source: Ambulatory Visit | Attending: Radiation Oncology | Admitting: Radiation Oncology

## 2012-09-24 ENCOUNTER — Ambulatory Visit
Admission: RE | Admit: 2012-09-24 | Discharge: 2012-09-24 | Disposition: A | Discharge status: Home / Self Care | Payer: Medicare Other | Source: Ambulatory Visit | Attending: Radiation Oncology | Admitting: Radiation Oncology

## 2012-09-24 ENCOUNTER — Ambulatory Visit: Payer: Medicare Other

## 2012-09-25 ENCOUNTER — Ambulatory Visit
Admission: RE | Admit: 2012-09-25 | Discharge: 2012-09-25 | Disposition: A | Discharge status: Home / Self Care | Payer: Medicare Other | Source: Ambulatory Visit | Attending: Radiation Oncology | Admitting: Radiation Oncology

## 2012-09-25 ENCOUNTER — Ambulatory Visit: Payer: Medicare Other

## 2012-09-25 DIAGNOSIS — C20 Malignant neoplasm of rectum: Secondary | ICD-10-CM

## 2012-09-25 MED ORDER — RADIAPLEXRX EX GEL
Freq: Once | CUTANEOUS | Status: AC
  Administered 2012-09-25: 18:00:00 via TOPICAL

## 2012-09-27 NOTE — Progress Notes (Signed)
   Department of Radiation Oncology  Phone:  [phone] Fax:        [phone]  Weekly Treatment Note    Name: Breanna Guerrero Date: 09/27/2012 MRN: [id number] DOB: 18-Jul-1927   Current dose: 7.2 Gy  Current fraction: 4   MEDICATIONS: Current Outpatient Prescriptions  Medication Sig Dispense Refill  . capecitabine (XELODA) 500 MG tablet Take 3 tablets (=1500 mg) in AM.  Then take 2 tablets (=1000 mg) in PM for daily total of 2500 mg.  Take on days of radiation ONLY.  125 tablet  0  . ibuprofen (ADVIL,MOTRIN) 200 MG tablet Take 200 mg by mouth every 6 (six) hours as needed for pain.      . metoprolol (LOPRESSOR) 50 MG tablet Take 50 mg by mouth daily.      Marland Kitchen NIFEdipine (PROCARDIA-XL/ADALAT-CC/NIFEDICAL-XL) 30 MG 24 hr tablet Take 30 mg by mouth daily.      . prochlorperazine (COMPAZINE) 10 MG tablet Take 1 tablet (10 mg total) by mouth every 6 (six) hours as needed.  60 tablet  1   No current facility-administered medications for this encounter.     ALLERGIES: Latex; Penicillins; and Sulfa antibiotics   LABORATORY DATA:  No results found for this basename: WBC, HGB, HCT, MCV, PLT   No results found for this basename: NA, K, CL, CO2   No results found for this basename: ALT, AST, GGT, ALKPHOS, BILITOT     NARRATIVE: Breanna Guerrero was seen today for weekly treatment management. The chart was checked and the patient's films were reviewed. The patient is doing well with her treatment. No problems so far. I met with the patient and her daughter. They have requested a second opinion with Dr. Emogene Morgan at [facility] with regards to the planned surgery, and they have asked me to place a request for this referral.  PHYSICAL EXAMINATION: vitals were not taken for this visit.     the patient is alert and oriented x3, in no acute distress, able to answer questions about her treatment appropriately.  ASSESSMENT: The patient is doing satisfactorily with treatment.  PLAN: We will continue  with the patient's radiation treatment as planned. I have put in the referral request as discussed above.

## 2012-09-28 ENCOUNTER — Ambulatory Visit
Admission: RE | Admit: 2012-09-28 | Discharge: 2012-09-28 | Disposition: A | Discharge status: Home / Self Care | Payer: Medicare Other | Source: Ambulatory Visit | Attending: Radiation Oncology | Admitting: Radiation Oncology

## 2012-09-28 ENCOUNTER — Telehealth: Payer: Self-pay | Admitting: *Deleted

## 2012-09-28 ENCOUNTER — Ambulatory Visit: Payer: Medicare Other

## 2012-09-28 NOTE — Telephone Encounter (Signed)
Called patient's daughter Misty Stanley to inform of appt. With Dr. Emogene Morgan on Friday May 2- arrival time - 9:15 am- ph. No. - [phone], spoke with patient's daughter - Misty Stanley and she is aware of this appt.

## 2012-09-29 ENCOUNTER — Telehealth: Payer: Self-pay | Admitting: *Deleted

## 2012-09-29 ENCOUNTER — Ambulatory Visit: Payer: Medicare Other

## 2012-09-29 ENCOUNTER — Ambulatory Visit
Admission: RE | Admit: 2012-09-29 | Discharge: 2012-09-29 | Disposition: A | Discharge status: Home / Self Care | Payer: Medicare Other | Source: Ambulatory Visit | Attending: Radiation Oncology | Admitting: Radiation Oncology

## 2012-09-29 NOTE — Telephone Encounter (Signed)
Phone call to patient to check in and assess for needs.  Spoke with patient's daughter, Misty Stanley, who stated her mother is doing well.  She is tolerating the Xeloda and XRT with minimal side effects at this point.  Reminded patient's daughter to please call if any needs arise.  She verbalized comprehension.

## 2012-09-30 ENCOUNTER — Ambulatory Visit: Payer: Medicare Other

## 2012-09-30 ENCOUNTER — Ambulatory Visit
Admission: RE | Admit: 2012-09-30 | Discharge: 2012-09-30 | Disposition: A | Discharge status: Home / Self Care | Payer: Medicare Other | Source: Ambulatory Visit | Attending: Radiation Oncology | Admitting: Radiation Oncology

## 2012-10-01 ENCOUNTER — Ambulatory Visit
Admission: RE | Admit: 2012-10-01 | Discharge: 2012-10-01 | Disposition: A | Discharge status: Home / Self Care | Payer: Medicare Other | Source: Ambulatory Visit | Attending: Radiation Oncology | Admitting: Radiation Oncology

## 2012-10-01 ENCOUNTER — Ambulatory Visit: Payer: Medicare Other

## 2012-10-02 ENCOUNTER — Ambulatory Visit
Admission: RE | Admit: 2012-10-02 | Discharge: 2012-10-02 | Disposition: A | Discharge status: Home / Self Care | Payer: Medicare Other | Source: Ambulatory Visit | Attending: Radiation Oncology | Admitting: Radiation Oncology

## 2012-10-02 ENCOUNTER — Ambulatory Visit: Payer: Medicare Other

## 2012-10-05 ENCOUNTER — Ambulatory Visit: Payer: Medicare Other

## 2012-10-05 ENCOUNTER — Ambulatory Visit
Admission: RE | Admit: 2012-10-05 | Discharge: 2012-10-05 | Disposition: A | Discharge status: Home / Self Care | Payer: Medicare Other | Source: Ambulatory Visit | Attending: Radiation Oncology | Admitting: Radiation Oncology

## 2012-10-05 VITALS — BP 134/74 | HR 66 | Temp 97.8°F | Wt 128.1 lb

## 2012-10-05 DIAGNOSIS — C20 Malignant neoplasm of rectum: Secondary | ICD-10-CM

## 2012-10-05 NOTE — Progress Notes (Signed)
Ms. Dapper here for under treat visit.  She has had 10/25 fractions to her rectum.  She denies pain, nausea and diarrhea.  She did have some numbness in her left finger last Thursday.  She denies skin irritation in the treatment area.  She denies rectal bleeding.

## 2012-10-05 NOTE — Progress Notes (Signed)
   Department of Radiation Oncology  Phone:  [phone] Fax:        [phone]  Weekly Treatment Note    Name: Breanna Guerrero Date: 10/05/2012 MRN: [id number] DOB: Jul 19, 1927   Current dose: 18 Gy  Current fraction: 10   MEDICATIONS: Current Outpatient Prescriptions  Medication Sig Dispense Refill  . capecitabine (XELODA) 500 MG tablet Take 3 tablets (=1500 mg) in AM.  Then take 2 tablets (=1000 mg) in PM for daily total of 2500 mg.  Take on days of radiation ONLY.  125 tablet  0  . ibuprofen (ADVIL,MOTRIN) 200 MG tablet Take 200 mg by mouth every 6 (six) hours as needed for pain.      . metoprolol (LOPRESSOR) 50 MG tablet Take 50 mg by mouth daily.      Marland Kitchen NIFEdipine (PROCARDIA-XL/ADALAT-CC/NIFEDICAL-XL) 30 MG 24 hr tablet Take 30 mg by mouth daily.      . prochlorperazine (COMPAZINE) 10 MG tablet Take 1 tablet (10 mg total) by mouth every 6 (six) hours as needed.  60 tablet  1   No current facility-administered medications for this encounter.     ALLERGIES: Latex; Penicillins; and Sulfa antibiotics   LABORATORY DATA:  No results found for this basename: WBC, HGB, HCT, MCV, PLT   No results found for this basename: NA, K, CL, CO2   No results found for this basename: ALT, AST, GGT, ALKPHOS, BILITOT     NARRATIVE: Breanna Guerrero was seen today for weekly treatment management. The chart was checked and the patient's films were reviewed. The patient is doing well with treatment so far. No nausea or diarrhea. No skin irritation in the treatment area so far.  PHYSICAL EXAMINATION: weight is 128 lb 1.6 oz (58.106 kg). Her temperature is 97.8 F (36.6 C). Her blood pressure is 134/74 and her pulse is 66.        ASSESSMENT: The patient is doing satisfactorily with treatment.  PLAN: We will continue with the patient's radiation treatment as planned.

## 2012-10-06 ENCOUNTER — Ambulatory Visit: Payer: Medicare Other

## 2012-10-06 ENCOUNTER — Encounter: Payer: Self-pay | Admitting: *Deleted

## 2012-10-06 ENCOUNTER — Ambulatory Visit
Admission: RE | Admit: 2012-10-06 | Discharge: 2012-10-06 | Disposition: A | Discharge status: Home / Self Care | Payer: Medicare Other | Source: Ambulatory Visit | Attending: Radiation Oncology | Admitting: Radiation Oncology

## 2012-10-06 NOTE — Progress Notes (Signed)
RECEIVED A FAX FROM [facility] CONCERNING A CONFIRMATION OF PRESCRIPTION SHIPMENT FOR CAPECITABINE ON 10/05/12.

## 2012-10-07 ENCOUNTER — Ambulatory Visit: Payer: Medicare Other

## 2012-10-07 ENCOUNTER — Ambulatory Visit
Admission: RE | Admit: 2012-10-07 | Discharge: 2012-10-07 | Disposition: A | Discharge status: Home / Self Care | Payer: Medicare Other | Source: Ambulatory Visit | Attending: Radiation Oncology | Admitting: Radiation Oncology

## 2012-10-08 ENCOUNTER — Other Ambulatory Visit (HOSPITAL_BASED_OUTPATIENT_CLINIC_OR_DEPARTMENT_OTHER): Payer: Medicare Other | Admitting: Lab

## 2012-10-08 ENCOUNTER — Ambulatory Visit
Admission: RE | Admit: 2012-10-08 | Discharge: 2012-10-08 | Disposition: A | Discharge status: Home / Self Care | Payer: Medicare Other | Source: Ambulatory Visit | Attending: Radiation Oncology | Admitting: Radiation Oncology

## 2012-10-08 ENCOUNTER — Ambulatory Visit (HOSPITAL_BASED_OUTPATIENT_CLINIC_OR_DEPARTMENT_OTHER): Payer: Medicare Other | Admitting: Nurse Practitioner

## 2012-10-08 ENCOUNTER — Ambulatory Visit: Payer: Medicare Other

## 2012-10-08 VITALS — BP 127/73 | HR 85 | Temp 97.8°F | Resp 18 | Ht 62.5 in | Wt 125.9 lb

## 2012-10-08 DIAGNOSIS — C20 Malignant neoplasm of rectum: Secondary | ICD-10-CM

## 2012-10-08 DIAGNOSIS — K5900 Constipation, unspecified: Secondary | ICD-10-CM

## 2012-10-08 LAB — CBC WITH DIFFERENTIAL/PLATELET
BASO%: 0.2 % (ref 0.0–2.0)
EOS%: 3.6 % (ref 0.0–7.0)
HCT: 34 % — ABNORMAL LOW (ref 34.8–46.6)
LYMPH%: 11.3 % — ABNORMAL LOW (ref 14.0–49.7)
MCH: 29.9 pg (ref 25.1–34.0)
MCHC: 34.7 g/dL (ref 31.5–36.0)
MCV: 86.1 fL (ref 79.5–101.0)
MONO#: 1 10*3/uL — ABNORMAL HIGH (ref 0.1–0.9)
MONO%: 15.7 % — ABNORMAL HIGH (ref 0.0–14.0)
NEUT%: 69.2 % (ref 38.4–76.8)
Platelets: 186 10*3/uL (ref 145–400)
RBC: 3.95 10*6/uL (ref 3.70–5.45)
WBC: 6.2 10*3/uL (ref 3.9–10.3)

## 2012-10-08 NOTE — Progress Notes (Signed)
OFFICE PROGRESS NOTE  Interval history:  Breanna Guerrero returns as scheduled. She continues radiation and Xeloda. She denies mouth sores. No nausea or vomiting. No diarrhea. No hand or foot pain or redness. She denies any rectal bleeding.   Objective: Blood pressure 127/73, pulse 85, temperature 97.8 F (36.6 C), temperature source Oral, resp. rate 18, height 5' 2.5" (1.588 m), weight 125 lb 14.4 oz (57.108 kg).  No thrush or ulceration. Lungs clear. Regular cardiac rhythm. Abdomen is soft and nontender. No hepatomegaly. Extremities are without edema. Palms are without erythema.  Lab Results: Lab Results  Component Value Date   WBC 6.2 10/08/2012   HGB 11.8 10/08/2012   HCT 34.0* 10/08/2012   MCV 86.1 10/08/2012   PLT 186 10/08/2012    Chemistry:    Chemistry   No results found for this basename: NA, K, CL, CO2, BUN, CREATININE, GLU   No results found for this basename: CALCIUM, ALKPHOS, AST, ALT, BILITOT       Studies/Results: No results found.  Medications: I have reviewed the patient's current medications.  Assessment/Plan:  1. Rectal cancer. Mid to distal rectal mass status post endoscopic biopsy 09/03/2012 with pathology confirming fragments of a tubulovillous adenoma with a small focus suspicious for adenocarcinoma; staging EUS T3 N1. 2. Constipation/rectal bleeding secondary to #1. She is no longer experiencing rectal bleeding. 3. History of a CVA May 2013 with no residual deficit.  Disposition-[name] appears stable. She appears to be tolerating the radiation and Xeloda well. She will return for a followup visit in 3 weeks. She will contact the office in the interim with any problems.  Plan reviewed with Dr. Truett Perna.  Lonna Cobb ANP/GNP-BC

## 2012-10-09 ENCOUNTER — Ambulatory Visit
Admission: RE | Admit: 2012-10-09 | Discharge: 2012-10-09 | Disposition: A | Discharge status: Home / Self Care | Payer: Medicare Other | Source: Ambulatory Visit | Attending: Radiation Oncology | Admitting: Radiation Oncology

## 2012-10-09 ENCOUNTER — Ambulatory Visit: Payer: Medicare Other

## 2012-10-09 VITALS — BP 110/61 | HR 86 | Temp 98.5°F | Ht 62.5 in | Wt 126.9 lb

## 2012-10-09 DIAGNOSIS — C20 Malignant neoplasm of rectum: Secondary | ICD-10-CM

## 2012-10-09 NOTE — Progress Notes (Signed)
   Department of Radiation Oncology  Phone:  [phone] Fax:        [phone]  Weekly Treatment Note    Name: Breanna Guerrero Date: 10/09/2012 MRN: [id number] DOB: 1927-11-13   Current dose: 20.2 Gy  Current fraction: 14   MEDICATIONS: Current Outpatient Prescriptions  Medication Sig Dispense Refill  . capecitabine (XELODA) 500 MG tablet Take 3 tablets (=1500 mg) in AM.  Then take 2 tablets (=1000 mg) in PM for daily total of 2500 mg.  Take on days of radiation ONLY.  125 tablet  0  . ibuprofen (ADVIL,MOTRIN) 200 MG tablet Take 200 mg by mouth every 6 (six) hours as needed for pain.      . metoprolol (LOPRESSOR) 50 MG tablet Take 50 mg by mouth daily.      Marland Kitchen NIFEdipine (PROCARDIA-XL/ADALAT-CC/NIFEDICAL-XL) 30 MG 24 hr tablet Take 30 mg by mouth daily.      . prochlorperazine (COMPAZINE) 10 MG tablet Take 1 tablet (10 mg total) by mouth every 6 (six) hours as needed.  60 tablet  1   No current facility-administered medications for this encounter.     ALLERGIES: Latex; Penicillins; and Sulfa antibiotics   LABORATORY DATA:  Lab Results  Component Value Date   WBC 6.2 10/08/2012   HGB 11.8 10/08/2012   HCT 34.0* 10/08/2012   MCV 86.1 10/08/2012   PLT 186 10/08/2012   No results found for this basename: NA, K, CL, CO2   No results found for this basename: ALT, AST, GGT, ALKPHOS, BILITOT     NARRATIVE: Breanna Guerrero was seen today for weekly treatment management. The chart was checked and the patient's films were reviewed. The patient is doing well. She is having some increased stools but no major problems. No diarrhea.  PHYSICAL EXAMINATION: height is 5' 2.5" (1.588 m) and weight is 126 lb 14.4 oz (57.561 kg). Her temperature is 98.5 F (36.9 C). Her blood pressure is 110/61 and her pulse is 86.        ASSESSMENT: The patient is doing satisfactorily with treatment.  PLAN: We will continue with the patient's radiation treatment as planned.

## 2012-10-09 NOTE — Progress Notes (Signed)
Breanna Guerrero is here for under treat visit.  She has had 14/25 fractions to her rectum.  She denies pain, diarrhea and nausea.  She is taking xeloda twice a day.  She states that she has noticed an increase in the amount of times she has to urinate during the night.  She denies hematuria.

## 2012-10-10 ENCOUNTER — Ambulatory Visit
Admission: RE | Admit: 2012-10-10 | Discharge: 2012-10-10 | Disposition: A | Discharge status: Home / Self Care | Payer: Medicare Other | Source: Ambulatory Visit | Attending: Radiation Oncology | Admitting: Radiation Oncology

## 2012-10-12 ENCOUNTER — Telehealth: Payer: Self-pay | Admitting: Oncology

## 2012-10-12 ENCOUNTER — Ambulatory Visit: Payer: Medicare Other

## 2012-10-12 ENCOUNTER — Ambulatory Visit
Admission: RE | Admit: 2012-10-12 | Discharge: 2012-10-12 | Disposition: A | Discharge status: Home / Self Care | Payer: Medicare Other | Source: Ambulatory Visit | Attending: Radiation Oncology | Admitting: Radiation Oncology

## 2012-10-13 ENCOUNTER — Ambulatory Visit
Admission: RE | Admit: 2012-10-13 | Discharge: 2012-10-13 | Disposition: A | Discharge status: Home / Self Care | Payer: Medicare Other | Source: Ambulatory Visit | Attending: Radiation Oncology | Admitting: Radiation Oncology

## 2012-10-13 ENCOUNTER — Ambulatory Visit: Payer: Medicare Other

## 2012-10-14 ENCOUNTER — Ambulatory Visit: Payer: Medicare Other

## 2012-10-14 ENCOUNTER — Ambulatory Visit
Admission: RE | Admit: 2012-10-14 | Discharge: 2012-10-14 | Disposition: A | Discharge status: Home / Self Care | Payer: Medicare Other | Source: Ambulatory Visit | Attending: Radiation Oncology | Admitting: Radiation Oncology

## 2012-10-15 ENCOUNTER — Ambulatory Visit: Payer: Medicare Other

## 2012-10-15 ENCOUNTER — Ambulatory Visit
Admission: RE | Admit: 2012-10-15 | Discharge: 2012-10-15 | Disposition: A | Discharge status: Home / Self Care | Payer: Medicare Other | Source: Ambulatory Visit | Attending: Radiation Oncology | Admitting: Radiation Oncology

## 2012-10-16 ENCOUNTER — Ambulatory Visit
Admission: RE | Admit: 2012-10-16 | Discharge: 2012-10-16 | Disposition: A | Discharge status: Home / Self Care | Payer: Medicare Other | Source: Ambulatory Visit | Attending: Radiation Oncology | Admitting: Radiation Oncology

## 2012-10-16 ENCOUNTER — Encounter: Payer: Self-pay | Admitting: Radiation Oncology

## 2012-10-16 VITALS — BP 106/60 | HR 84 | Temp 98.1°F | Ht 62.5 in | Wt 126.6 lb

## 2012-10-16 DIAGNOSIS — C20 Malignant neoplasm of rectum: Secondary | ICD-10-CM

## 2012-10-16 MED ORDER — RADIAPLEXRX EX GEL
Freq: Once | CUTANEOUS | Status: AC
  Administered 2012-10-16: 16:00:00 via TOPICAL

## 2012-10-16 NOTE — Addendum Note (Signed)
Encounter addended by: Eduardo Osier, RN on: 10/16/2012  3:52 PM<BR>     Documentation filed: Orders

## 2012-10-16 NOTE — Progress Notes (Signed)
  Radiation Oncology         [phone] ________________________________  Name: AMORY ZBIKOWSKI MRN: [id number]  Date: 10/16/2012  DOB: 1927-08-11  COMPLEX SIMULATION  NOTE  Diagnosis: rectal cancer  Narrative The patient has initially been planned to receive a course of radiation treatment to a dose of 45 gray in 25 fractions at 1.8 gray per fraction. The patient will now receive a boost to the high risk target volume for an additional 5.4 gray. This will be delivered in 3 fractions at 1.8 gray per fraction and a cone down boost technique will be utilized. To accomplish this, an additional 4 customized blocks have been designed for this purpose. A complex isodose plan is requested to ensure that the high-risk target region receives the appropriate radiation dose and that the nearby normal structures continue to be appropriately spared. The patient's final total dose therefore will be 50.4 gray.   ________________________________ ------------------------------------------------  Radene Gunning, MD, PhD

## 2012-10-16 NOTE — Addendum Note (Signed)
Encounter addended by: Eduardo Osier, RN on: 10/16/2012  3:55 PM<BR>     Documentation filed: Inpatient MAR

## 2012-10-16 NOTE — Progress Notes (Signed)
   Department of Radiation Oncology  Phone:  [phone] Fax:        [phone]  Weekly Treatment Note    Name: Breanna Guerrero Date: 10/16/2012 MRN: [id number] DOB: 1927/07/29   Current dose: 34.2 Gy  Current fraction: 19   MEDICATIONS: Current Outpatient Prescriptions  Medication Sig Dispense Refill  . capecitabine (XELODA) 500 MG tablet Take 3 tablets (=1500 mg) in AM.  Then take 2 tablets (=1000 mg) in PM for daily total of 2500 mg.  Take on days of radiation ONLY.  125 tablet  0  . ibuprofen (ADVIL,MOTRIN) 200 MG tablet Take 200 mg by mouth every 6 (six) hours as needed for pain.      . metoprolol (LOPRESSOR) 50 MG tablet Take 50 mg by mouth daily.      Marland Kitchen NIFEdipine (PROCARDIA-XL/ADALAT-CC/NIFEDICAL-XL) 30 MG 24 hr tablet Take 30 mg by mouth daily.      . prochlorperazine (COMPAZINE) 10 MG tablet Take 1 tablet (10 mg total) by mouth every 6 (six) hours as needed.  60 tablet  1   No current facility-administered medications for this encounter.     ALLERGIES: Latex; Penicillins; and Sulfa antibiotics   LABORATORY DATA:  Lab Results  Component Value Date   WBC 6.2 10/08/2012   HGB 11.8 10/08/2012   HCT 34.0* 10/08/2012   MCV 86.1 10/08/2012   PLT 186 10/08/2012   No results found for this basename: NA, K, CL, CO2   No results found for this basename: ALT, AST, GGT, ALKPHOS, BILITOT     NARRATIVE: Breanna Guerrero was seen today for weekly treatment management. The chart was checked and the patient's films were reviewed. The patient is doing fairly well. She had a little abdominal pain yesterday I did not need to take anything. This is better today. She does complain of some fatigue. Some loose stools, no diarrhea.  PHYSICAL EXAMINATION: height is 5' 2.5" (1.588 m) and weight is 126 lb 9.6 oz (57.425 kg). Her temperature is 98.1 F (36.7 C). Her blood pressure is 106/60 and her pulse is 84.        ASSESSMENT: The patient is doing satisfactorily with treatment.  PLAN: We will  continue with the patient's radiation treatment as planned.

## 2012-10-16 NOTE — Progress Notes (Signed)
Ms. Straus here with her daughter.  She has had 19/25 fractions to her rectum.  She is having pain in her lower abdomen that started yesterday.  She is rating it a 4.  She says it was worse yesterday.  She does have fatigue.  She denies diarrhea, nausea and frequent urination.  She is wondering if she should take her Xeloda on memorial day when she does not have radiation treatment.

## 2012-10-19 ENCOUNTER — Ambulatory Visit
Admission: RE | Admit: 2012-10-19 | Discharge: 2012-10-19 | Disposition: A | Discharge status: Home / Self Care | Payer: Medicare Other | Source: Ambulatory Visit | Attending: Radiation Oncology | Admitting: Radiation Oncology

## 2012-10-19 ENCOUNTER — Ambulatory Visit: Payer: Medicare Other

## 2012-10-20 ENCOUNTER — Ambulatory Visit
Admission: RE | Admit: 2012-10-20 | Discharge: 2012-10-20 | Disposition: A | Discharge status: Home / Self Care | Payer: Medicare Other | Source: Ambulatory Visit | Attending: Radiation Oncology | Admitting: Radiation Oncology

## 2012-10-21 ENCOUNTER — Ambulatory Visit
Admission: RE | Admit: 2012-10-21 | Discharge: 2012-10-21 | Disposition: A | Discharge status: Home / Self Care | Payer: Medicare Other | Source: Ambulatory Visit | Attending: Radiation Oncology | Admitting: Radiation Oncology

## 2012-10-21 ENCOUNTER — Ambulatory Visit: Payer: Medicare Other

## 2012-10-22 ENCOUNTER — Ambulatory Visit: Payer: Medicare Other

## 2012-10-22 ENCOUNTER — Ambulatory Visit
Admission: RE | Admit: 2012-10-22 | Discharge: 2012-10-22 | Disposition: A | Discharge status: Home / Self Care | Payer: Medicare Other | Source: Ambulatory Visit | Attending: Radiation Oncology | Admitting: Radiation Oncology

## 2012-10-23 ENCOUNTER — Ambulatory Visit
Admission: RE | Admit: 2012-10-23 | Discharge: 2012-10-23 | Disposition: A | Discharge status: Home / Self Care | Payer: Medicare Other | Source: Ambulatory Visit | Attending: Radiation Oncology | Admitting: Radiation Oncology

## 2012-10-23 VITALS — BP 82/56 | HR 91 | Ht 62.5 in | Wt 125.7 lb

## 2012-10-23 DIAGNOSIS — C20 Malignant neoplasm of rectum: Secondary | ICD-10-CM

## 2012-10-23 NOTE — Progress Notes (Signed)
   Department of Radiation Oncology  Phone:  [phone] Fax:        [phone]  Weekly Treatment Note    Name: Breanna Guerrero Date: 10/23/2012 MRN: [id number] DOB: 1927-10-30   Current dose: 43.2 Gy  Current fraction: 24   MEDICATIONS: Current Outpatient Prescriptions  Medication Sig Dispense Refill  . capecitabine (XELODA) 500 MG tablet Take 3 tablets (=1500 mg) in AM.  Then take 2 tablets (=1000 mg) in PM for daily total of 2500 mg.  Take on days of radiation ONLY.  125 tablet  0  . hyaluronate sodium (RADIAPLEXRX) GEL Apply 1 application topically 2 (two) times daily.      Marland Kitchen loperamide (IMODIUM) 1 MG/5ML solution Take 1 mg by mouth 4 (four) times daily as needed for diarrhea or loose stools.      . metoprolol (LOPRESSOR) 50 MG tablet Take 50 mg by mouth daily.      Marland Kitchen NIFEdipine (PROCARDIA-XL/ADALAT-CC/NIFEDICAL-XL) 30 MG 24 hr tablet Take 30 mg by mouth daily.      Marland Kitchen ibuprofen (ADVIL,MOTRIN) 200 MG tablet Take 200 mg by mouth every 6 (six) hours as needed for pain.      Marland Kitchen prochlorperazine (COMPAZINE) 10 MG tablet Take 1 tablet (10 mg total) by mouth every 6 (six) hours as needed.  60 tablet  1   No current facility-administered medications for this encounter.     ALLERGIES: Latex; Penicillins; and Sulfa antibiotics   LABORATORY DATA:  Lab Results  Component Value Date   WBC 6.2 10/08/2012   HGB 11.8 10/08/2012   HCT 34.0* 10/08/2012   MCV 86.1 10/08/2012   PLT 186 10/08/2012   No results found for this basename: NA, K, CL, CO2   No results found for this basename: ALT, AST, GGT, ALKPHOS, BILITOT     NARRATIVE: Breanna Guerrero was seen today for weekly treatment management. The chart was checked and the patient's films were reviewed. The patient date she continues to do quite well. She had a little bit of diarrhea this week which was controlled with Imodium. She is also had a rash on her upper left chest for a couple of months. This is more prominent over the last several  days.  PHYSICAL EXAMINATION: height is 5' 2.5" (1.588 m) and weight is 125 lb 11.2 oz (57.017 kg). Her blood pressure is 82/56 and her pulse is 91.      the patient has a circular rash in the left upper chest as described.  ASSESSMENT: The patient is doing satisfactorily with treatment. GI toxicity/side effects are being well-controlled currently.  PLAN: We will continue with the patient's radiation treatment as planned. The patient will talk to her primary care physician about her rash if this persists. I don't believe this is related to her current treatment.

## 2012-10-23 NOTE — Progress Notes (Addendum)
Ms. Hone here with her daiughter for weekly under treat visit.  She has had 24/25 fractions to her rectum.  She denies pain.  Her bp today is 82/56.  She did not take her metoprolol this morning.  She did have diarrhea this week and took imodium for it.  She does have a circular rash on her upper neck.  She is using radiaplex gel.  She denies nausea.

## 2012-10-27 ENCOUNTER — Ambulatory Visit
Admission: RE | Admit: 2012-10-27 | Discharge: 2012-10-27 | Disposition: A | Discharge status: Home / Self Care | Payer: Medicare Other | Source: Ambulatory Visit | Attending: Radiation Oncology | Admitting: Radiation Oncology

## 2012-10-28 ENCOUNTER — Ambulatory Visit
Admission: RE | Admit: 2012-10-28 | Discharge: 2012-10-28 | Disposition: A | Discharge status: Home / Self Care | Payer: Medicare Other | Source: Ambulatory Visit | Attending: Radiation Oncology | Admitting: Radiation Oncology

## 2012-10-28 DIAGNOSIS — C20 Malignant neoplasm of rectum: Secondary | ICD-10-CM

## 2012-10-29 ENCOUNTER — Ambulatory Visit
Admission: RE | Admit: 2012-10-29 | Discharge: 2012-10-29 | Disposition: A | Discharge status: Home / Self Care | Payer: Medicare Other | Source: Ambulatory Visit | Attending: Radiation Oncology | Admitting: Radiation Oncology

## 2012-10-29 ENCOUNTER — Other Ambulatory Visit (HOSPITAL_BASED_OUTPATIENT_CLINIC_OR_DEPARTMENT_OTHER): Payer: Medicare Other | Admitting: Lab

## 2012-10-29 ENCOUNTER — Ambulatory Visit: Payer: Medicare Other | Admitting: Nurse Practitioner

## 2012-10-29 ENCOUNTER — Ambulatory Visit (HOSPITAL_BASED_OUTPATIENT_CLINIC_OR_DEPARTMENT_OTHER): Payer: Medicare Other | Admitting: Nurse Practitioner

## 2012-10-29 VITALS — BP 109/65 | HR 87 | Temp 97.6°F | Resp 18 | Ht 62.0 in | Wt 125.2 lb

## 2012-10-29 DIAGNOSIS — C20 Malignant neoplasm of rectum: Secondary | ICD-10-CM

## 2012-10-29 LAB — CBC WITH DIFFERENTIAL/PLATELET
BASO%: 0.2 % (ref 0.0–2.0)
EOS%: 1.8 % (ref 0.0–7.0)
HCT: 33.1 % — ABNORMAL LOW (ref 34.8–46.6)
MCH: 31.3 pg (ref 25.1–34.0)
MCHC: 34.4 g/dL (ref 31.5–36.0)
MONO#: 0.9 10*3/uL (ref 0.1–0.9)
NEUT%: 73.3 % (ref 38.4–76.8)
RBC: 3.64 10*6/uL — ABNORMAL LOW (ref 3.70–5.45)
RDW: 19.6 % — ABNORMAL HIGH (ref 11.2–14.5)
WBC: 5.4 10*3/uL (ref 3.9–10.3)
lymph#: 0.4 10*3/uL — ABNORMAL LOW (ref 0.9–3.3)

## 2012-10-29 NOTE — Progress Notes (Signed)
OFFICE PROGRESS NOTE  Interval history:  Breanna Guerrero returns as scheduled. She continues radiation and Xeloda. She denies nausea/vomiting. No mouth sores. No diarrhea. No hand or foot pain or redness. She denies rectal bleeding.   Objective: Blood pressure 109/65, pulse 87, temperature 97.6 F (36.4 C), temperature source Oral, resp. rate 18, height 5\' 2"  (1.575 m), weight 125 lb 3.2 oz (56.79 kg).  Oropharynx is without thrush or ulceration. Lungs are clear. Regular cardiac rhythm. Abdomen is soft and nontender. No hepatomegaly. Extremities are without edema. Palms are without erythema.  Lab Results: Lab Results  Component Value Date   WBC 5.4 10/29/2012   HGB 11.4* 10/29/2012   HCT 33.1* 10/29/2012   MCV 91.0 10/29/2012   PLT 206 10/29/2012    Chemistry:    Chemistry   No results found for this basename: NA, K, CL, CO2, BUN, CREATININE, GLU   No results found for this basename: CALCIUM, ALKPHOS, AST, ALT, BILITOT       Studies/Results: No results found.  Medications: I have reviewed the patient's current medications.  Assessment/Plan:  1. Rectal cancer. Mid to distal rectal mass status post endoscopic biopsy 09/03/2012 with pathology confirming fragments of a tubulovillous adenoma with a small focus suspicious for adenocarcinoma; staging EUS T3 N1. She began radiation and Xeloda 09/22/2012. 2. Constipation/rectal bleeding secondary to #1. She is no longer experiencing rectal bleeding. 3. History of a CVA May 2013 with no residual deficit.  Disposition-Ms. Rials appears stable. She will complete the course of radiation on 10/30/2012. She understands to discontinue Xeloda coinciding with the completion of radiation. She has a followup appointment with her surgeon at [facility] on 12/11/2012. She will return for a followup visit here the week of 01/11/2013. She will contact the office in the interim with any problems.  Patient seen with Dr. Truett Perna.  Lonna Cobb ANP/GNP-BC

## 2012-10-30 ENCOUNTER — Encounter: Payer: Self-pay | Admitting: Radiation Oncology

## 2012-10-30 ENCOUNTER — Ambulatory Visit: Payer: Medicare Other

## 2012-10-30 ENCOUNTER — Ambulatory Visit
Admission: RE | Admit: 2012-10-30 | Discharge: 2012-10-30 | Disposition: A | Discharge status: Home / Self Care | Payer: Medicare Other | Source: Ambulatory Visit | Attending: Radiation Oncology | Admitting: Radiation Oncology

## 2012-10-30 ENCOUNTER — Telehealth: Payer: Self-pay | Admitting: Oncology

## 2012-10-30 VITALS — BP 115/63 | HR 87 | Temp 97.9°F | Resp 20 | Wt 125.9 lb

## 2012-10-30 DIAGNOSIS — C20 Malignant neoplasm of rectum: Secondary | ICD-10-CM

## 2012-10-30 NOTE — Telephone Encounter (Signed)
lvm for pt regarding to Aug appt....mailed appt sched and letter to pt

## 2012-10-30 NOTE — Progress Notes (Signed)
Weekly rad tx  28/28 completed  Today, no c/o pain or discomfort, no nausea, regular bowel mpvements, has only taken 3-4 doses imodium during the whole treatments, skin intact, no erythema, 1 month f/u appt given to patient 2:25 PM

## 2012-10-30 NOTE — Progress Notes (Signed)
   Department of Radiation Oncology  Phone:  [phone] Fax:        [phone]  Weekly Treatment Note    Name: Breanna Guerrero Date: 10/30/2012 MRN: [id number] DOB: 1927/11/08   Current dose: 50.4 Gy  Current fraction: 28   MEDICATIONS: Current Outpatient Prescriptions  Medication Sig Dispense Refill  . capecitabine (XELODA) 500 MG tablet Take 3 tablets (=1500 mg) in AM.  Then take 2 tablets (=1000 mg) in PM for daily total of 2500 mg.  Take on days of radiation ONLY.  125 tablet  0  . hyaluronate sodium (RADIAPLEXRX) GEL Apply 1 application topically 2 (two) times daily.      Marland Kitchen ibuprofen (ADVIL,MOTRIN) 200 MG tablet Take 200 mg by mouth every 6 (six) hours as needed for pain.      Marland Kitchen loperamide (IMODIUM) 1 MG/5ML solution Take 1 mg by mouth 4 (four) times daily as needed for diarrhea or loose stools.      . metoprolol (LOPRESSOR) 50 MG tablet Take 50 mg by mouth daily.      Marland Kitchen NIFEdipine (PROCARDIA-XL/ADALAT-CC/NIFEDICAL-XL) 30 MG 24 hr tablet Take 30 mg by mouth daily.       No current facility-administered medications for this encounter.     ALLERGIES: Latex; Penicillins; and Sulfa antibiotics   LABORATORY DATA:  Lab Results  Component Value Date   WBC 5.4 10/29/2012   HGB 11.4* 10/29/2012   HCT 33.1* 10/29/2012   MCV 91.0 10/29/2012   PLT 206 10/29/2012   No results found for this basename: NA, K, CL, CO2   No results found for this basename: ALT, AST, GGT, ALKPHOS, BILITOT     NARRATIVE: Breanna Guerrero was seen today for weekly treatment management. The chart was checked and the patient's films were reviewed. The patient finished her final fraction today. She has done well. No nausea, no diarrhea, no anal/rectal irritation.  PHYSICAL EXAMINATION: weight is 125 lb 14.4 oz (57.108 kg). Her oral temperature is 97.9 F (36.6 C). Her blood pressure is 115/63 and her pulse is 87. Her respiration is 20.        ASSESSMENT: The patient did satisfactorily with  treatment.  PLAN: Followup in one month.

## 2012-11-06 NOTE — Progress Notes (Signed)
  Radiation Oncology         [phone] ________________________________  Name: Breanna Guerrero MRN: [id number]  Date: 10/30/2012  DOB: [birth year]  End of Treatment Note  Diagnosis:   Rectal cancer     Indication for treatment:  Curative       Radiation treatment dates:   09/22/2012 through 10/30/2012  Site/dose:   The patient was initially treated to the pelvis including the rectal tumor and nodal regions. This delivered 45 gray in 25 fractions. The patient then received a boost treatment for an additional 5.4 gray. The total dose was 50.4 gray. Both the original plan and the boost treatment consisted of a 4 field plans with the original plan corresponding to a 3-D conformal course of radiotherapy.  Narrative: The patient tolerated radiation treatment relatively well.   The patient really did not have substantial problems during treatment. Her energy level remain quite good and she did not have significant difficulties with GI issues.  Plan: The patient has completed radiation treatment. The patient will return to radiation oncology clinic for routine followup in one month. I advised the patient to call or return sooner if they have any questions or concerns related to their recovery or treatment. ________________________________  Radene Gunning, M.D., Ph.D.

## 2012-11-06 NOTE — Progress Notes (Signed)
  Radiation Oncology         [phone]) [phone] ________________________________  Name: Breanna Guerrero MRN: [id number]  Date: 10/28/2012  DOB: [birth year]  Simulation Verification Note   NARRATIVE: The patient was brought to the treatment unit and placed in the planned treatment position for the patient's boost treatment. The clinical setup was verified. Then port films were obtained and uploaded to the radiation oncology medical record software.  The treatment beams were carefully compared against the planned radiation fields. The position, location, and shape of the radiation fields was reviewed. The targeted volume of tissue appears to be appropriately covered by the radiation beams. Based on my personal review, I approved the simulation verification. The patient's treatment will proceed as planned.  ________________________________   Radene Gunning, MD, PhD

## 2012-11-17 ENCOUNTER — Encounter (INDEPENDENT_AMBULATORY_CARE_PROVIDER_SITE_OTHER): Payer: Federal, State, Local not specified - PPO | Admitting: General Surgery

## 2012-12-16 ENCOUNTER — Ambulatory Visit: Payer: Medicare Other | Admitting: Radiation Oncology

## 2012-12-24 ENCOUNTER — Ambulatory Visit: Payer: Medicare Other | Admitting: Radiation Oncology

## 2013-01-12 ENCOUNTER — Ambulatory Visit: Payer: Medicare Other | Admitting: Oncology

## 2013-01-13 ENCOUNTER — Telehealth: Payer: Self-pay | Admitting: *Deleted

## 2013-01-13 NOTE — Telephone Encounter (Signed)
Spoke with patient's son in law on the phone.  He stated Ms. Detamore had her surgery at [facility] on 01/07/13.  He stated she had post-op complications and was currently in ICU at [facility].  This RN will make Dr. Truett Perna aware.  This RN asked patient's son in law to please let patient know she is being thought of , that we are thinking of her, and to please contact this office if we can do anything to assist.

## 2013-03-04 ENCOUNTER — Telehealth: Payer: Self-pay | Admitting: *Deleted

## 2013-03-04 NOTE — Telephone Encounter (Signed)
Spoke with patient's daughter, Misty Stanley.  She called requesting a follow-up appointment with Dr. Truett Perna.  Dr. Truett Perna aware and will send POF to scheduler with appointment for next week.

## 2013-03-05 ENCOUNTER — Telehealth: Payer: Self-pay | Admitting: Oncology

## 2013-03-05 NOTE — Telephone Encounter (Signed)
Called pt's daughter and left message for appt on 03/08/13 MD only

## 2013-03-08 ENCOUNTER — Ambulatory Visit (HOSPITAL_BASED_OUTPATIENT_CLINIC_OR_DEPARTMENT_OTHER): Payer: Medicare Other | Admitting: Oncology

## 2013-03-08 ENCOUNTER — Ambulatory Visit (HOSPITAL_COMMUNITY)
Admission: RE | Admit: 2013-03-08 | Discharge: 2013-03-08 | Disposition: A | Discharge status: Home / Self Care | Payer: Medicare Other | Source: Ambulatory Visit | Attending: Oncology | Admitting: Oncology

## 2013-03-08 ENCOUNTER — Telehealth: Payer: Self-pay | Admitting: Oncology

## 2013-03-08 ENCOUNTER — Ambulatory Visit (HOSPITAL_BASED_OUTPATIENT_CLINIC_OR_DEPARTMENT_OTHER): Payer: Medicare Other | Admitting: Lab

## 2013-03-08 VITALS — BP 133/63 | HR 84 | Temp 97.4°F | Resp 17 | Ht 62.0 in | Wt 115.8 lb

## 2013-03-08 DIAGNOSIS — K5900 Constipation, unspecified: Secondary | ICD-10-CM

## 2013-03-08 DIAGNOSIS — Z932 Ileostomy status: Secondary | ICD-10-CM | POA: Insufficient documentation

## 2013-03-08 DIAGNOSIS — C20 Malignant neoplasm of rectum: Secondary | ICD-10-CM

## 2013-03-08 DIAGNOSIS — M79609 Pain in unspecified limb: Secondary | ICD-10-CM

## 2013-03-08 NOTE — Progress Notes (Signed)
VASCULAR LAB PRELIMINARY  PRELIMINARY  PRELIMINARY  PRELIMINARY  Right lower extremity venous duplex completed.    Preliminary report:  Right:  No evidence of DVT, superficial thrombosis, or Baker's cyst.  Margee Trentham, RVT 03/08/2013, 2:31 PM

## 2013-03-08 NOTE — Telephone Encounter (Signed)
gv adn printed appt sched and avs for pt for OCT and Jan 2015

## 2013-03-08 NOTE — Progress Notes (Signed)
[facility]    OFFICE PROGRESS NOTE   INTERVAL HISTORY:   [name] returns for followup of rectal cancer. She completed neoadjuvant Xeloda and radiation on 10/30/2012. She was taken to the operating room at [facility] by Dr. Emogene Morgan on 01/07/2013 for a low anterior resection and diverting loop ileostomy. A distal rectal cancer was confirmed at the time of surgery. No evidence of pelvic, peritoneal, or liver metastases. She had a complicated postoperative course and was discharged on 01/20/2013. She developed atrial fibrillation and acute respiratory failure. She has remained at home since discharge from the hospital. She completed a home physical therapy program and reports developing discomfort in the right thigh after exercising. She was evaluated at [facility] and diagnosed with a "pulled muscle ". The right thigh discomfort persist with weightbearing.  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 Tumor invaded through the muscularis propria into the subserosal adipose tissue. The resection margins were negative. A minimal treatment effect was noted. Lymphovascular invasion was not identified. No carcinoma and 21 lymph nodes.  She returns to discuss adjuvant treatment options. [name] is managing the ileostomy well. She empties the bag approximately 4 times per day. The stool is partially formed.  Objective:  Vital signs in last 24 hours:  Blood pressure 133/63, pulse 84, temperature 97.4 F (36.3 C), temperature source Oral, resp. rate 17, height 5\' 2"  (1.575 m), weight 115 lb 12.8 oz (52.527 kg).    HEENT: Neck without mass Lymphatics: No cervical, supraclavicular, axillary, or inguinal nodes Resp: Lungs clear bilaterally Cardio: Regular rhythm with premature beats GI: No hepatomegaly, healed midline incision, right lower quadrant ostomy with semi-formed brown stool Vascular: The right thigh appears slightly  larger than the left side. No erythema or palpable cord. No low leg edema.  Lab Results:  CEA pending   Medications: I have reviewed the patient's current medications.  Assessment/Plan: 1. Rectal cancer. Mid to distal rectal mass status post endoscopic biopsy 09/03/2012 with pathology confirming fragments of a tubulovillous adenoma with a small focus suspicious for adenocarcinoma; staging EUS T3 N1. She began radiation and Xeloda 09/22/2012, completed 10/30/2012  Low anterior resection/diverting ileostomy 01/07/2013 confirming a pT3,pN0 tumor with negative surgical margins, 21 negative lymph nodes  2. Constipation/rectal bleeding secondary to #1. She is no longer experiencing rectal bleeding. 3. History of a CVA May 2013 with no residual deficit. 4.  discomfort at the right thigh with a question of right leg enlargement-she will be referred for a Doppler of the right leg today   Disposition:  Mr. Breanna Guerrero continues to recover from the low anterior resection. She is now greater than 8 weeks out from surgery. She was diagnosed with a pathologic stage II rectal cancer. She had clinical stage III disease at presentation. We discussed the indication for adjuvant chemotherapy. I explained the "standard" is to complete a six-month course of 5-fluorouracil based therapy in patients with clinical stage III rectal cancer. However the absolute benefit from additional chemotherapy in her case is likely small. She had a complicated postoperative course and has not completely recovered from surgery. We reviewed the risk/potential benefits of a 4-5 month course of Xeloda. She is comfortable with an observation approach.  We checked a CEA today 2 followup on the elevated preoperative CEA. [name] will see Dr.Bohl to plan the ileostomy reversal. She will return for an office visit and CEA in 3 months.  We will check a Doppler  of the right leg today to rule out a deep vein thrombosis.  Approximately 25  minutes were spent with the patient today. The majority of the time was used for counseling and coordination of care.   Thornton Papas, MD  03/08/2013  2:20 PM

## 2013-03-09 LAB — CEA: CEA: 0.7 ng/mL (ref 0.0–5.0)

## 2013-03-10 ENCOUNTER — Telehealth: Payer: Self-pay | Admitting: *Deleted

## 2013-03-10 NOTE — Telephone Encounter (Signed)
Message copied by Caleb Popp on Wed Mar 10, 2013 12:20 PM ------      Message from: Ladene Artist      Created: Tue Mar 09, 2013  6:36 PM       Please call patient, cea is normal and doppler was negative ------

## 2013-03-10 NOTE — Telephone Encounter (Signed)
Called pt's daughter with CEA and doppler results. Normal, per Dr. Truett Perna. Misty Stanley voiced understanding and stated she will let her mother know.

## 2013-04-17 ENCOUNTER — Encounter (HOSPITAL_COMMUNITY): Payer: Self-pay | Admitting: Emergency Medicine

## 2013-04-17 ENCOUNTER — Inpatient Hospital Stay (HOSPITAL_COMMUNITY)
Admission: EM | Admit: 2013-04-17 | Discharge: 2013-04-30 | DRG: 871 | Disposition: A | Discharge status: Skilled Nursing Facility | Payer: Medicare Other | Attending: Internal Medicine | Admitting: Internal Medicine

## 2013-04-17 ENCOUNTER — Emergency Department (HOSPITAL_COMMUNITY): Payer: Medicare Other

## 2013-04-17 DIAGNOSIS — K8020 Calculus of gallbladder without cholecystitis without obstruction: Secondary | ICD-10-CM | POA: Diagnosis present

## 2013-04-17 DIAGNOSIS — E871 Hypo-osmolality and hyponatremia: Secondary | ICD-10-CM | POA: Diagnosis present

## 2013-04-17 DIAGNOSIS — Z8673 Personal history of transient ischemic attack (TIA), and cerebral infarction without residual deficits: Secondary | ICD-10-CM

## 2013-04-17 DIAGNOSIS — D696 Thrombocytopenia, unspecified: Secondary | ICD-10-CM | POA: Diagnosis present

## 2013-04-17 DIAGNOSIS — Z85048 Personal history of other malignant neoplasm of rectum, rectosigmoid junction, and anus: Secondary | ICD-10-CM

## 2013-04-17 DIAGNOSIS — E872 Acidosis, unspecified: Secondary | ICD-10-CM | POA: Diagnosis not present

## 2013-04-17 DIAGNOSIS — R6510 Systemic inflammatory response syndrome (SIRS) of non-infectious origin without acute organ dysfunction: Secondary | ICD-10-CM

## 2013-04-17 DIAGNOSIS — R6520 Severe sepsis without septic shock: Secondary | ICD-10-CM | POA: Insufficient documentation

## 2013-04-17 DIAGNOSIS — T8089XA Other complications following infusion, transfusion and therapeutic injection, initial encounter: Secondary | ICD-10-CM | POA: Diagnosis not present

## 2013-04-17 DIAGNOSIS — I498 Other specified cardiac arrhythmias: Secondary | ICD-10-CM | POA: Diagnosis present

## 2013-04-17 DIAGNOSIS — A419 Sepsis, unspecified organism: Principal | ICD-10-CM

## 2013-04-17 DIAGNOSIS — N179 Acute kidney failure, unspecified: Secondary | ICD-10-CM

## 2013-04-17 DIAGNOSIS — I509 Heart failure, unspecified: Secondary | ICD-10-CM | POA: Diagnosis present

## 2013-04-17 DIAGNOSIS — G9340 Encephalopathy, unspecified: Secondary | ICD-10-CM | POA: Diagnosis present

## 2013-04-17 DIAGNOSIS — J690 Pneumonitis due to inhalation of food and vomit: Secondary | ICD-10-CM | POA: Diagnosis present

## 2013-04-17 DIAGNOSIS — E861 Hypovolemia: Secondary | ICD-10-CM | POA: Diagnosis present

## 2013-04-17 DIAGNOSIS — I10 Essential (primary) hypertension: Secondary | ICD-10-CM | POA: Diagnosis present

## 2013-04-17 DIAGNOSIS — E875 Hyperkalemia: Secondary | ICD-10-CM | POA: Diagnosis present

## 2013-04-17 DIAGNOSIS — E860 Dehydration: Secondary | ICD-10-CM

## 2013-04-17 DIAGNOSIS — Z79899 Other long term (current) drug therapy: Secondary | ICD-10-CM

## 2013-04-17 DIAGNOSIS — J810 Acute pulmonary edema: Secondary | ICD-10-CM

## 2013-04-17 DIAGNOSIS — J9600 Acute respiratory failure, unspecified whether with hypoxia or hypercapnia: Secondary | ICD-10-CM

## 2013-04-17 DIAGNOSIS — E8809 Other disorders of plasma-protein metabolism, not elsewhere classified: Secondary | ICD-10-CM | POA: Diagnosis present

## 2013-04-17 DIAGNOSIS — H5316 Psychophysical visual disturbances: Secondary | ICD-10-CM | POA: Diagnosis not present

## 2013-04-17 DIAGNOSIS — D649 Anemia, unspecified: Secondary | ICD-10-CM

## 2013-04-17 DIAGNOSIS — Z933 Colostomy status: Secondary | ICD-10-CM

## 2013-04-17 DIAGNOSIS — K219 Gastro-esophageal reflux disease without esophagitis: Secondary | ICD-10-CM | POA: Diagnosis present

## 2013-04-17 DIAGNOSIS — J189 Pneumonia, unspecified organism: Secondary | ICD-10-CM

## 2013-04-17 DIAGNOSIS — R251 Tremor, unspecified: Secondary | ICD-10-CM | POA: Diagnosis present

## 2013-04-17 DIAGNOSIS — Z9221 Personal history of antineoplastic chemotherapy: Secondary | ICD-10-CM

## 2013-04-17 DIAGNOSIS — K209 Esophagitis, unspecified without bleeding: Secondary | ICD-10-CM | POA: Diagnosis present

## 2013-04-17 DIAGNOSIS — C20 Malignant neoplasm of rectum: Secondary | ICD-10-CM

## 2013-04-17 DIAGNOSIS — E876 Hypokalemia: Secondary | ICD-10-CM | POA: Diagnosis not present

## 2013-04-17 DIAGNOSIS — Y849 Medical procedure, unspecified as the cause of abnormal reaction of the patient, or of later complication, without mention of misadventure at the time of the procedure: Secondary | ICD-10-CM | POA: Diagnosis not present

## 2013-04-17 DIAGNOSIS — F411 Generalized anxiety disorder: Secondary | ICD-10-CM | POA: Diagnosis present

## 2013-04-17 DIAGNOSIS — D638 Anemia in other chronic diseases classified elsewhere: Secondary | ICD-10-CM

## 2013-04-17 DIAGNOSIS — E440 Moderate protein-calorie malnutrition: Secondary | ICD-10-CM | POA: Insufficient documentation

## 2013-04-17 DIAGNOSIS — I4891 Unspecified atrial fibrillation: Secondary | ICD-10-CM

## 2013-04-17 DIAGNOSIS — D72829 Elevated white blood cell count, unspecified: Secondary | ICD-10-CM | POA: Diagnosis present

## 2013-04-17 DIAGNOSIS — Z923 Personal history of irradiation: Secondary | ICD-10-CM

## 2013-04-17 DIAGNOSIS — E869 Volume depletion, unspecified: Secondary | ICD-10-CM

## 2013-04-17 DIAGNOSIS — R1310 Dysphagia, unspecified: Secondary | ICD-10-CM

## 2013-04-17 DIAGNOSIS — R5381 Other malaise: Secondary | ICD-10-CM | POA: Diagnosis present

## 2013-04-17 DIAGNOSIS — I4892 Unspecified atrial flutter: Secondary | ICD-10-CM | POA: Diagnosis not present

## 2013-04-17 DIAGNOSIS — D509 Iron deficiency anemia, unspecified: Secondary | ICD-10-CM | POA: Diagnosis present

## 2013-04-17 HISTORY — DX: Unspecified atrial fibrillation: I48.91

## 2013-04-17 LAB — BASIC METABOLIC PANEL
CO2: 13 mEq/L — ABNORMAL LOW (ref 19–32)
Calcium: 8 mg/dL — ABNORMAL LOW (ref 8.4–10.5)
Creatinine, Ser: 2.22 mg/dL — ABNORMAL HIGH (ref 0.50–1.10)
GFR calc non Af Amer: 19 mL/min — ABNORMAL LOW (ref >90)
Glucose, Bld: 78 mg/dL (ref 70–99)
Potassium: 5.7 mEq/L — ABNORMAL HIGH (ref 3.5–5.1)
Sodium: 126 mEq/L — ABNORMAL LOW (ref 135–145)

## 2013-04-17 LAB — MAGNESIUM: Magnesium: 1.6 mg/dL (ref 1.5–2.5)

## 2013-04-17 MED ORDER — METOPROLOL TARTRATE 12.5 MG HALF TABLET
12.5000 mg | ORAL_TABLET | Freq: Two times a day (BID) | ORAL | Status: DC
  Administered 2013-04-17: 12.5 mg via ORAL
  Filled 2013-04-17 (×3): qty 1

## 2013-04-17 MED ORDER — SODIUM CHLORIDE 0.9 % IJ SOLN
3.0000 mL | Freq: Two times a day (BID) | INTRAMUSCULAR | Status: DC
  Administered 2013-04-17 – 2013-04-18 (×2): 3 mL via INTRAVENOUS
  Administered 2013-04-19: 11:00:00 via INTRAVENOUS
  Administered 2013-04-20 – 2013-04-29 (×13): 3 mL via INTRAVENOUS

## 2013-04-17 MED ORDER — DEXTROSE-NACL 5-0.9 % IV SOLN
INTRAVENOUS | Status: DC
  Administered 2013-04-17 – 2013-04-18 (×2): via INTRAVENOUS
  Administered 2013-04-18: 750 mL via INTRAVENOUS

## 2013-04-17 MED ORDER — ACETAMINOPHEN 325 MG PO TABS: 650.0000 mg | ORAL_TABLET | Freq: Four times a day (QID) | ORAL | Status: DC | PRN

## 2013-04-17 MED ORDER — ENOXAPARIN SODIUM 30 MG/0.3ML ~~LOC~~ SOLN
30.0000 mg | SUBCUTANEOUS | Status: DC
  Administered 2013-04-17: 30 mg via SUBCUTANEOUS
  Filled 2013-04-17 (×2): qty 0.3

## 2013-04-17 MED ORDER — ONDANSETRON HCL 4 MG/2ML IJ SOLN
4.0000 mg | Freq: Four times a day (QID) | INTRAMUSCULAR | Status: DC | PRN
  Filled 2013-04-17: qty 2

## 2013-04-17 MED ORDER — SODIUM CHLORIDE 0.9 % IV SOLN
INTRAVENOUS | Status: DC
  Administered 2013-04-17: 21:00:00 via INTRAVENOUS

## 2013-04-17 MED ORDER — ACETAMINOPHEN 650 MG RE SUPP: 650.0000 mg | Freq: Four times a day (QID) | RECTAL | Status: DC | PRN

## 2013-04-17 MED ORDER — ONDANSETRON HCL 4 MG PO TABS: 4.0000 mg | ORAL_TABLET | Freq: Four times a day (QID) | ORAL | Status: DC | PRN

## 2013-04-17 NOTE — ED Notes (Signed)
Pt refused to allow blood to be drawn for labs

## 2013-04-17 NOTE — ED Notes (Signed)
Per [employer], pt was here at [facility], got sick, wanted to go home, family took pt home. Family was unable to reach pt, called law enforcement to house. Pt refused to go to ED by ems. Pt was driven by family member. Pt sent here for dehydration and pneumonia. Family requested pt to be sent here due to pts husband here as well.

## 2013-04-17 NOTE — H&P (Signed)
Date: 04/17/2013               Patient Name:  Breanna Guerrero MRN: [id number]  DOB: 1928-03-09 Age / Sex: 79 y.o., female   PCP: Quentin Angst, MD         Medical Service: Internal Medicine Teaching Service         Attending Physician: Dr. [name], MD    First Contact: Dr. Evelena Peat Pager: [phone]  Second Contact: Dr. Christen Bame Pager: [phone]       After Hours (After 5p/  First Contact Pager: [phone]  weekends / holidays): Second Contact Pager: [phone]   Chief Complaint: Altered mental status  History of Present Illness:  Breanna Guerrero is a 80 y.o. female PMH rectal cancer s/p neoadjuvant chemotherapy, radiation, and low anterior resection and diverting loop ileostomy on 01/07/13, hypertension who presents as a transfer from [facility] with a chief complaint of altered mental status. Her son-in-law Brett Canales was with her and helped provide the history. He is a radiologist here at [facility] and requested she be brought here for care.  Patient was taken to [facility] by ambulance this afternoon after being found on the floor of her house confused, agitated, weak, and tremulous. Her family had called 911 on her behalf because she was not picking up her telephone. Patient apparently "slid off the couch" and could not reach her phone, which was only about 3 feet away. Currently she reports chills/shivers but denies fever, chest pain, cough, abdominal pain, nausea, vomiting, increased ostomy output, dysuria. She says she feels short of breath only when she shivers, and also that her throat is dry.  Son-in-law reports that 4 nights prior to today, she had a similar episode of tremulousness, weakness, and confusion and was brought to the [facility] ED. The doctors there said she was dehydrated, gave her fluids, and sent her home. At that time her son-in-law felt her symptoms might have been at least somewhat anxiety driven; her husband is recovering in the hospital after a  ruptured appendix and sepsis, and she has been concerned about him coming home and having to take care for him. They live alone in [place].  Son-in-law also reports that the patient has had decreased po intake for the past 5 weeks. Her appetite is normal, but she gets full after only a few bites and sometimes complains of dysphagia symptoms. Otherwise he has not noted significant changes in her symptoms or behavior.  Regarding her history of rectal cancer, Breanna Guerrero is followed by Dr. Truett Perna. She is s/p low anterior resection and diverting loop ileostomy on 01/07/13 at [facility]. Her hospital course was apparently complicated by atrial fibrillation and acute respiratory failure. 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 Since discharge she has been living at home and successfully completed a course of home PT. She elected not to pursue adjuvant chemotherapy given successful surgery, and instead to take an observational approach. Post-op CEAs have been within normal limits. Her ileostomy reversal was scheduled to happen in the next few months.  She denies alcohol, tobacco, or drug use. She denies using benzodiazepines. Her PCP is Dr. Francine Graven.  Home meds include metoprolol prn hypertension, which she has not required since her surgery per the son-in-law.  At [facility] she was noted to be tachycardic and dehydrated on arrival, with a WBC of 22. She was given 3L of IV  fluids, vancomycin, Zosyn, and levofloxacin, Cardizem for supposed atrial flutter (but no 12-lead EKG was performed).   Meds: Current Facility-Administered Medications  Medication Dose Route Frequency Provider Last Rate Last Dose  . 0.9 %  sodium chloride infusion   Intravenous Continuous Linward Headland, MD      . acetaminophen (TYLENOL) tablet 650 mg  650 mg Oral Q6H PRN Linward Headland, MD       Or  . acetaminophen (TYLENOL)  suppository 650 mg  650 mg Rectal Q6H PRN Linward Headland, MD      . enoxaparin (LOVENOX) injection 30 mg  30 mg Subcutaneous Q24H Linward Headland, MD      . ondansetron [facility]) tablet 4 mg  4 mg Oral Q6H PRN Linward Headland, MD       Or  . ondansetron [facility]) injection 4 mg  4 mg Intravenous Q6H PRN Linward Headland, MD      . sodium chloride 0.9 % injection 3 mL  3 mL Intravenous Q12H Linward Headland, MD        Allergies: Allergies as of 04/17/2013 - Review Complete 04/17/2013  Allergen Reaction Noted  . Penicillins  09/11/2012  . Sulfa antibiotics  09/11/2012  . Latex Rash 09/09/2012   Past Medical History  Diagnosis Date  . Hypertension   . Stroke May 2013    mild stroke  . Hemorrhoids   . Cancer     Rectal adenocarcinoma   Past Surgical History  Procedure Laterality Date  . Hemorrhoid surgery    . Colonoscopy    . Eus N/A 09/09/2012    Procedure: LOWER ENDOSCOPIC ULTRASOUND (EUS);  Surgeon: Willis Modena, MD;  Location: [facility];  Service: Endoscopy;  Laterality: N/A;  moderate sedation  . Rectal biopsy  09/03/2012    Suspicious Adenocarcinoma   Family History  Problem Relation Age of Onset  . Breast cancer Sister   . Brain cancer Sister   . Leukemia Maternal Aunt    History   Social History  . Marital Status: Married    Spouse Name: N/A    Number of Children: 2  . Years of Education: N/A   Occupational History  .     Social History Main Topics  . Smoking status: Never Smoker   . Smokeless tobacco: Not on file  . Alcohol Use: No  . Drug Use: No  . Sexual Activity: Not on file   Other Topics Concern  . Not on file   Social History Narrative   Married   1 daughter here in [place] and 1 son in [place]   Resident of [place] but staying with daughter for treatment   Parents had no history of cancer    Review of Systems: Pertinent items are noted in HPI.  Physical Exam: Blood pressure 128/53, pulse 115, temperature 97.8 F (36.6 C), temperature source  Oral, resp. rate 25, height 5\' 2"  (1.575 m), weight 122 lb 5.7 oz (55.5 kg), SpO2 95.00%. Physical Exam  Constitutional: She appears dehydrated. She appears distressed.  Appears unkempt. Agitated at having to answer questions.  HENT:  Head: Normocephalic and atraumatic.  Mouth/Throat: Uvula is midline. Mucous membranes are dry.  Neck: Normal range of motion. Neck supple.  Cardiovascular: Regular rhythm and normal heart sounds.  Tachycardia present.  Exam reveals no gallop and no friction rub.   No murmur heard. Pulmonary/Chest: Breath sounds normal. She has no wheezes. She has no rales.  Pursing lips with respiration. RR 24.  Abdominal: Soft. She exhibits no distension and no mass. There is no tenderness. There is no rebound and no guarding.  Bowel sounds somewhat hyperactive. Ostomy on right. Site is pink and non-tender. Bag is full of light brown/green, loose output. Slight foul smell.  Musculoskeletal: Normal range of motion. She exhibits no edema and no tenderness.  Lymphadenopathy:    She has no cervical adenopathy.  Neurological: She is alert. No cranial nerve deficit.  Oriented x3 but with delayed responses (took a minute to tell us her full name).  Skin: Skin is warm and dry. No rash noted.  Decreased skin turgor. No evidence of rash or cellulitis on back, legs.  Psychiatric:  Agitated.    Lab results: At [facility] 04/17/13 @1201 :  CMP: Na 126 (baseline ~130 per [facility] records), K 5.6, Cl 94, CO2 15, AG 17, BUN 61, Cr 3.7 (baseline ~0.7-0.9 per [facility] records), GFR 12.6, Glu 132, AST 23, ALT 41, Total bili 1.1, Ca 8.6, Alk phos 250, total protein 6.3, albumin 2.4  CBC with diff: WBC 22.3, HGB 9.9 (baseline ~10-11), HCT 29, MCV 82, Neut% 98.6, Neut # 21.9, bandemia (31%)  Lactic acid = 2.8  CK = 20  Troponin = 0.03  UA is clean, shows trace ketones, no nitrites or LE, 0-2 WBCs, no bacteria seen   Imaging results:  Dg Chest Port 1 View  04/17/2013   CLINICAL DATA:  Found  on her floor this afternoon, dehydration, elevated white blood cell count, history of cancer, renal insufficiency and hypertension  EXAM: PORTABLE CHEST - 1 VIEW  COMPARISON:  Chest CT -09/04/2012  FINDINGS: Normal cardiac silhouette and mediastinal contours. The lungs appear hyperexpanded. Minimal bibasilar atelectasis. Trace bilateral effusions not excluded. No evidence of pulmonary edema. No pneumothorax. Osteopenia without definite acute osseus abnormality.  IMPRESSION: Bibasilar atelectasis and possible trace bilateral effusions without definite acute cardiopulmonary disease on this AP portable examination. Further evaluation with a PA and lateral chest radiograph may be obtained as clinically indicated.   Electronically Signed   By: Simonne Come M.D.   On: 04/17/2013 18:04    Other results: EKG: sinus tachycardia.  Assessment & Plan by Problem: MADDALYN LUTZE is a 79 y.o. female PMH rectal cancer s/p neoadjuvant chemotherapy, radiation, and low anterior resection and diverting loop ileostomy on 01/07/13, hypertension who presents as a transfer from [facility] with a chief complaint of altered mental status.  #Volume depletion - Patient appears dehydrated on exam with decreased skin turgor, dry mucous membranes, sinus tachycardia. She has also had some soft blood pressures documented (90s/50s), though is currently normotensive. Family reports a 5-week history of decreased po intake, worsened over the past few days in the setting on anxiety over husband's health. Her labs from [facility] reinforce volume depletion and chronically decreased po intake, showing low albumin, hyponatremia, and elevated creatinine. She is supposedly s/p 3L NS at the OSH. - Admit to IMTS, telemetry bed - Volume resuscitation with D5 - NS @ 100cc/hr - Repeat BMP, lactic acid, magnesium, phosphorus - PT/OT  #Acute kidney injury - Cr elevated to 3.7 from a baseline of 0.7-0.9. Likely pre-renal etiology given volume  depletion on exam. UA within normal limits. - IV fluids as above - Repeat CMP in am  #Leukocytosis with left-shift - No clear infectious source. Patient is afebrile. Chest x-ray is clear, though this is in the setting of dehydration. Normal lung exam without crackles, O2 saturation 95-99%. No signs of skin or soft tissue infection on  exam. UA is clean and patient denies dysuria. Surgical site infection/abscess unlikely as surgery was in August. Could be a stress response. Infectious diarrhea/C. Difficile a possibility given hospitalizations and loose ostomy output, but patient denies change in bowel patterns. She is s/p vancomycin, Zosyn, levofloxacin at OSH. - IV fluids as above - Checking ostomy output for C. diff - Continue to monitor vital signs - Repeat CBC with diff in am  #Elevated anion gap - AG 17. Likely multifactorial from mildly elevated lactate (2.8) likely secondary to volume depletion, uremia (BUN 61), possibly starvation ketoacidosis (trace ketones in urine). - IV fluids as above - Repeat CMP in am  #Tachycardia - OSH reported atrial flutter and gave her Cardizem, but they did not perform a 12-lead EKG so were unable to send [place] documentation of this. She has a history of post-operative atrial fibrillation. Patient is currently in sinus tachycardia with rate of 100-115.  - Telemetry bed - Giving Metoprolol 12.5mg  bid  #Dysphagia - Low concern for aspiration at this time, but per family patient has been experiencing dysphagia and this has perhaps hindered her ability to tolerate po. - Regular diet - SLP evaluate and treat in am  #Tremulousness - Unclear etiology. Patient denies alcohol or benzo use, so withdrawal is unlikely. She is afebrile. - Checking TSH  #DVT PPX - Lovenox subq.  #Code status - Full code.   Dispo: Disposition is deferred at this time, awaiting improvement of current medical problems. Anticipated discharge in approximately 1-3 day(s).   The patient  does have a current PCP (Rufus Effie Shy III, MD) and does need an [facility] hospital follow-up appointment after discharge.  The patient does not have transportation limitations that hinder transportation to clinic appointments.  Signed: Vivi Barrack, MD 04/17/2013, 8:48 PM

## 2013-04-17 NOTE — Progress Notes (Signed)
New Admission Note:   Arrival Method: Via stretcher from ED with EMT Mental Orientation: Confused, irritable and very agitated  Telemetry: Placed on box 21 Assessment: Completed Skin: WNL. Warm, dry and intact  IV: Clean, dry and intact. Appriopiate date. MD ordered IV Fluids Pain: States no pain. FACES scale matches Tubes: Came with a foley already in place. Lower right side colostomy in place as well (not given in report, found on assessment)  Safety Measures: Fall plan safety given/discussed with patient and with son in law Brett Canales. Brett Canales signed paper Admission: Completed  [facility] Orientation: Patient and family oriented to the room, unit and the staff.  Family: Son in Engineer, agricultural at bedside. Updated new contact information as well   Orders have been reviewed and implemented. Will continue to monitor the patient. Call light has been placed within reach and bed alarm has been activated.   Doristine Devoid, RN  Phone number: [phone]

## 2013-04-17 NOTE — ED Notes (Signed)
Admitting MDs at bedside. Pt is shaking, and is irritated with the doctors.

## 2013-04-17 NOTE — ED Provider Notes (Signed)
CSN: [id number]     Arrival date & time 04/17/13  1652 History   First MD Initiated Contact with Patient 04/17/13 1657     Chief Complaint  Patient presents with  . Dehydration  . Pneumonia   (Consider location/radiation/quality/duration/timing/severity/associated sxs/prior Treatment) HPI Comments: 79 year old female with a history of rectal cancer presenting from [facility] emergency department secondary to atrial flutter with RVR, pneumonia, dehydration. She was reportedly found with decreased responsiveness at her home prior to being transferred to [place]. According to their documentation, she was tachycardic, hypotensive. She was given 3 L of IV fluids, vancomycin, Zosyn, Cardizem. she had a leukocytosis of 22,000 and hyperkalemia to 5.6.  She is unable to provide much history secondary to confusion and no family is accompanying her at time of initial evaluation.    Level V Caveat.   Past Medical History  Diagnosis Date  . Hypertension   . Stroke May 2013    mild stroke  . Hemorrhoids   . Cancer     Rectal adenocarcinoma   Past Surgical History  Procedure Laterality Date  . Hemorrhoid surgery    . Colonoscopy    . Eus N/A 09/09/2012    Procedure: LOWER ENDOSCOPIC ULTRASOUND (EUS);  Surgeon: Willis Modena, MD;  Location: [facility];  Service: Endoscopy;  Laterality: N/A;  moderate sedation  . Rectal biopsy  09/03/2012    Suspicious Adenocarcinoma   Family History  Problem Relation Age of Onset  . Breast cancer Sister   . Brain cancer Sister   . Leukemia Maternal Aunt    History  Substance Use Topics  . Smoking status: Never Smoker   . Smokeless tobacco: Not on file  . Alcohol Use: No   OB History   Grav Para Term Preterm Abortions TAB SAB Ect Mult Living                 Review of Systems  Unable to perform ROS: Mental status change    Allergies  Latex; Penicillins; and Sulfa antibiotics  Home Medications   Current Outpatient Rx  Name  Route  Sig   Dispense  Refill  . ibuprofen (ADVIL,MOTRIN) 200 MG tablet   Oral   Take 200 mg by mouth every 6 (six) hours as needed for pain.         Marland Kitchen loperamide (IMODIUM) 1 MG/5ML solution   Oral   Take 1 mg by mouth 4 (four) times daily as needed for diarrhea or loose stools.         . metoprolol (LOPRESSOR) 50 MG tablet   Oral   Take 50 mg by mouth daily.         . naproxen sodium (ANAPROX) 220 MG tablet   Oral   Take 220 mg by mouth 2 (two) times daily with a meal.          BP 100/57  Pulse 102  Temp(Src) 98.2 F (36.8 C)  Resp 16  SpO2 95% Physical Exam  Nursing note and vitals reviewed. Constitutional: She is oriented to person, place, and time. She appears well-developed and well-nourished. No distress.  HENT:  Head: Normocephalic and atraumatic.  Mouth/Throat: Oropharynx is clear and moist.  Eyes: Conjunctivae are normal. Pupils are equal, round, and reactive to light. No scleral icterus.  Neck: Neck supple.  Cardiovascular: Regular rhythm, normal heart sounds and intact distal pulses.  Tachycardia present.   No murmur heard. Pulmonary/Chest: Effort normal and breath sounds normal. No stridor. No respiratory distress. She has  no rales.  Abdominal: Soft. Bowel sounds are normal. She exhibits no distension. There is no tenderness. There is no rigidity, no rebound and no guarding.  Ostomy in place, pink stoma.  Musculoskeletal: Normal range of motion.  Neurological: She is alert and oriented to person, place, and time.  Appears somewhat confused.    Skin: Skin is warm and dry. No rash noted.  Psychiatric: She has a normal mood and affect. Her behavior is normal.    ED Course  Procedures (including critical care time) Labs Review All labs drawn in ED reviewed.  Imaging Review Dg Chest Port 1 View  04/17/2013   CLINICAL DATA:  Found on her floor this afternoon, dehydration, elevated white blood cell count, history of cancer, renal insufficiency and hypertension   EXAM: PORTABLE CHEST - 1 VIEW  COMPARISON:  Chest CT -09/04/2012  FINDINGS: Normal cardiac silhouette and mediastinal contours. The lungs appear hyperexpanded. Minimal bibasilar atelectasis. Trace bilateral effusions not excluded. No evidence of pulmonary edema. No pneumothorax. Osteopenia without definite acute osseus abnormality.  IMPRESSION: Bibasilar atelectasis and possible trace bilateral effusions without definite acute cardiopulmonary disease on this AP portable examination. Further evaluation with a PA and lateral chest radiograph may be obtained as clinically indicated.   Electronically Signed   By: Simonne Come M.D.   On: 04/17/2013 18:04  All radiology studies independently viewed by me.     EKG Interpretation     Ventricular Rate:  103 PR Interval:  194 QRS Duration: 86 QT Interval:  341 QTC Calculation: 446 R Axis:   91 Text Interpretation:  Sinus tachycardia Right axis deviation Consider anterior infarct Compared to previous tracing rate increased             MDM   1. Acute kidney injury   2. Volume depletion   3. Rectal cancer    79 yo female with hx of rectal cancer presenting as transfer from [facility] ED with dehydration, a flutter with RVR, and acute kidney injury.  Given vanco and zosyn prior to transfer to treat sepsis/possible pneumonia.   HR better on arrival, and in sinus rhythm.   She had mild hyperkalemia prior to transfer, treated with calcium.  She was given 3 L NS prior to transfer.  She refused blood draw, making it difficult to assess for improvement after fluids, but EKG had no signs concerning for hyperkalemia.  Consulted Internal Medicine for admission and further workup.      Candyce Churn, MD 04/18/13 [phone]

## 2013-04-17 NOTE — ED Notes (Signed)
Xray at the bedside.

## 2013-04-18 ENCOUNTER — Inpatient Hospital Stay (HOSPITAL_COMMUNITY): Payer: Medicare Other

## 2013-04-18 DIAGNOSIS — D638 Anemia in other chronic diseases classified elsewhere: Secondary | ICD-10-CM | POA: Diagnosis present

## 2013-04-18 DIAGNOSIS — E869 Volume depletion, unspecified: Secondary | ICD-10-CM

## 2013-04-18 DIAGNOSIS — A419 Sepsis, unspecified organism: Secondary | ICD-10-CM

## 2013-04-18 DIAGNOSIS — R6520 Severe sepsis without septic shock: Secondary | ICD-10-CM | POA: Insufficient documentation

## 2013-04-18 DIAGNOSIS — N179 Acute kidney failure, unspecified: Secondary | ICD-10-CM

## 2013-04-18 DIAGNOSIS — R000 Tachycardia, unspecified: Secondary | ICD-10-CM

## 2013-04-18 LAB — CBC WITH DIFFERENTIAL/PLATELET
Basophils Absolute: 0 10*3/uL (ref 0.0–0.1)
Basophils Relative: 0 % (ref 0–1)
Eosinophils Absolute: 0.1 10*3/uL (ref 0.0–0.7)
Eosinophils Relative: 1 % (ref 0–5)
Hemoglobin: 7.8 g/dL — ABNORMAL LOW (ref 12.0–15.0)
Lymphs Abs: 0.1 10*3/uL — ABNORMAL LOW (ref 0.7–4.0)
MCH: 28.1 pg (ref 26.0–34.0)
MCHC: 36.3 g/dL — ABNORMAL HIGH (ref 30.0–36.0)
MCV: 77.3 fL — ABNORMAL LOW (ref 78.0–100.0)
Monocytes Absolute: 0.2 10*3/uL (ref 0.1–1.0)
Neutrophils Relative %: 96 % — ABNORMAL HIGH (ref 43–77)
Platelets: 84 10*3/uL — ABNORMAL LOW (ref 150–400)
RBC: 2.78 MIL/uL — ABNORMAL LOW (ref 3.87–5.11)
RDW: 16 % — ABNORMAL HIGH (ref 11.5–15.5)

## 2013-04-18 LAB — COMPREHENSIVE METABOLIC PANEL
AST: 38 U/L — ABNORMAL HIGH (ref 0–37)
Albumin: 1.8 g/dL — ABNORMAL LOW (ref 3.5–5.2)
BUN: 59 mg/dL — ABNORMAL HIGH (ref 6–23)
BUN: 54 mg/dL — ABNORMAL HIGH (ref 6–23)
CO2: 12 mEq/L — ABNORMAL LOW (ref 19–32)
Calcium: 7.8 mg/dL — ABNORMAL LOW (ref 8.4–10.5)
Calcium: 7.3 mg/dL — ABNORMAL LOW (ref 8.4–10.5)
Chloride: 101 mEq/L (ref 96–112)
Chloride: 103 mEq/L (ref 96–112)
Creatinine, Ser: 2.46 mg/dL — ABNORMAL HIGH (ref 0.50–1.10)
Creatinine, Ser: 2.2 mg/dL — ABNORMAL HIGH (ref 0.50–1.10)
GFR calc Af Amer: 20 mL/min — ABNORMAL LOW (ref >90)
GFR calc non Af Amer: 17 mL/min — ABNORMAL LOW (ref >90)
Glucose, Bld: 121 mg/dL — ABNORMAL HIGH (ref 70–99)
Total Bilirubin: 1.7 mg/dL — ABNORMAL HIGH (ref 0.3–1.2)
Total Bilirubin: 2.4 mg/dL — ABNORMAL HIGH (ref 0.3–1.2)
Total Protein: 4.9 g/dL — ABNORMAL LOW (ref 6.0–8.3)

## 2013-04-18 LAB — CBC
HCT: 19.6 % — ABNORMAL LOW (ref 36.0–46.0)
Hemoglobin: 7 g/dL — ABNORMAL LOW (ref 12.0–15.0)
MCH: 27.6 pg (ref 26.0–34.0)
MCV: 77.2 fL — ABNORMAL LOW (ref 78.0–100.0)
Platelets: 76 10*3/uL — ABNORMAL LOW (ref 150–400)
RBC: 2.54 MIL/uL — ABNORMAL LOW (ref 3.87–5.11)
RDW: 16.1 % — ABNORMAL HIGH (ref 11.5–15.5)
WBC: 20.8 10*3/uL — ABNORMAL HIGH (ref 4.0–10.5)

## 2013-04-18 LAB — BLOOD GAS, ARTERIAL
Drawn by: 252031
FIO2: 1 %
O2 Saturation: 97.1 %
Patient temperature: 98.6
pO2, Arterial: 236 mmHg — ABNORMAL HIGH (ref 80.0–100.0)

## 2013-04-18 LAB — TECHNOLOGIST SMEAR REVIEW: Tech Review: INCREASED

## 2013-04-18 LAB — SAVE SMEAR

## 2013-04-18 LAB — LACTATE DEHYDROGENASE: LDH: 281 U/L — ABNORMAL HIGH (ref 94–250)

## 2013-04-18 LAB — HAPTOGLOBIN: Haptoglobin: 328 mg/dL — ABNORMAL HIGH (ref 45–215)

## 2013-04-18 LAB — TSH: TSH: 2.784 u[IU]/mL (ref 0.350–4.500)

## 2013-04-18 MED ORDER — LORAZEPAM 2 MG/ML IJ SOLN
INTRAMUSCULAR | Status: AC
  Filled 2013-04-18: qty 1

## 2013-04-18 MED ORDER — LORAZEPAM 2 MG/ML IJ SOLN
1.0000 mg | Freq: Once | INTRAMUSCULAR | Status: AC
  Administered 2013-04-18: 1 mg via INTRAVENOUS

## 2013-04-18 MED ORDER — PIPERACILLIN-TAZOBACTAM IN DEX 2-0.25 GM/50ML IV SOLN
2.2500 g | Freq: Three times a day (TID) | INTRAVENOUS | Status: DC
  Filled 2013-04-18 (×2): qty 50

## 2013-04-18 MED ORDER — FUROSEMIDE 10 MG/ML IJ SOLN
INTRAMUSCULAR | Status: AC
  Filled 2013-04-18: qty 4

## 2013-04-18 MED ORDER — SODIUM CHLORIDE 0.9 % IV SOLN
INTRAVENOUS | Status: DC
  Administered 2013-04-18: 150 mL/h via INTRAVENOUS
  Administered 2013-04-19: 06:00:00 via INTRAVENOUS
  Administered 2013-04-19: 150 mL/h via INTRAVENOUS

## 2013-04-18 MED ORDER — WHITE PETROLATUM GEL
Status: AC
  Administered 2013-04-18: 0.2
  Filled 2013-04-18: qty 5

## 2013-04-18 MED ORDER — FUROSEMIDE 10 MG/ML IJ SOLN
40.0000 mg | Freq: Once | INTRAMUSCULAR | Status: AC
  Administered 2013-04-18: 40 mg via INTRAVENOUS

## 2013-04-18 MED ORDER — HEPARIN SODIUM (PORCINE) 5000 UNIT/ML IJ SOLN
5000.0000 [IU] | Freq: Three times a day (TID) | INTRAMUSCULAR | Status: DC
  Administered 2013-04-18: 5000 [IU] via SUBCUTANEOUS
  Filled 2013-04-18 (×7): qty 1

## 2013-04-18 MED ORDER — DEXTROSE 5 % IV BOLUS: 1000.0000 mL | Freq: Once | INTRAVENOUS | Status: DC

## 2013-04-18 MED ORDER — SODIUM CHLORIDE 0.9 % IV BOLUS (SEPSIS)
500.0000 mL | Freq: Once | INTRAVENOUS | Status: AC
  Administered 2013-04-18: 500 mL via INTRAVENOUS

## 2013-04-18 MED ORDER — IOHEXOL 300 MG/ML  SOLN
25.0000 mL | INTRAMUSCULAR | Status: AC
  Administered 2013-04-18 (×2): 25 mL via ORAL

## 2013-04-18 MED ORDER — SODIUM CHLORIDE 0.9 % IV BOLUS (SEPSIS): 1000.0000 mL | Freq: Once | INTRAVENOUS | Status: DC

## 2013-04-18 MED ORDER — SODIUM CHLORIDE 0.9 % IV BOLUS (SEPSIS)
1000.0000 mL | Freq: Once | INTRAVENOUS | Status: AC
  Administered 2013-04-18: 500 mL via INTRAVENOUS

## 2013-04-18 MED ORDER — PIPERACILLIN-TAZOBACTAM IN DEX 2-0.25 GM/50ML IV SOLN
2.2500 g | Freq: Three times a day (TID) | INTRAVENOUS | Status: DC
  Administered 2013-04-18 – 2013-04-21 (×11): 2.25 g via INTRAVENOUS
  Filled 2013-04-18 (×14): qty 50

## 2013-04-18 NOTE — Progress Notes (Signed)
SLP Cancellation Note  Patient Details Name: TUNYA HELD MRN: [id number] DOB: April 05, 1928   Cancelled treatment:   Received order for BSE.  Attempt x1 but not completed as patient to be transported for medical procedure per RN.  ST to complete evaluation on 04/19/13. Moreen Fowler MS, CCC-SLP [phone] [facility] 04/18/2013, 5:03 PM

## 2013-04-18 NOTE — Progress Notes (Signed)
Attempted to call report to [facility] 2C will call back when RN assigned.

## 2013-04-18 NOTE — Evaluation (Signed)
Physical Therapy Evaluation Patient Details Name: Breanna Guerrero MRN: [id number] DOB: [birth year] Today's Date: 04/18/2013 Time: 0835-0900 PT Time Calculation (min): 25 min  PT Assessment / Plan / Recommendation History of Present Illness  Pt is a 79 y.o. female adm secondary to fall and dehydration. PMH includes rectal cancer s/p neoadjuvant chemotherapy, radiation, low anterior resection and diverting loop ileostomy on 8/07/201 [facility]). Pt was D/C with daughter and reports she received HHPT and now c/o Rt groin pain. Pt was brought into [facility] secondary to fall and AMS. Pt husband is at [facility] CIR .   Clinical Impression  Pt adm due to the above. Presents to therapy with decreased independence with mobility secondary to deficits listed below (see PT problem list). Pt to benefit from skilled PT to return to PLOF and increase independence with functional mobility. Pt is a great candidate for CIR secondary to family support and motivation to return to independence. Pt husband is now in CIR for rehab and has great family support from Daughter.     PT Assessment  Patient needs continued PT services    Follow Up Recommendations  CIR    Does the patient have the potential to tolerate intense rehabilitation      Barriers to Discharge Decreased caregiver support pt can D/C with daughter; husband is in hospital at this time; pt wants to go home independently     Equipment Recommendations  Other (comment) (TBD)    Recommendations for Other Services Rehab consult;OT consult   Frequency Min 3X/week    Precautions / Restrictions Precautions Precautions: Fall Precaution Comments: recent fall at home Restrictions Weight Bearing Restrictions: No   Pertinent Vitals/Pain C/o pain in low back. patient repositioned for comfort       Mobility  Bed Mobility Bed Mobility: Supine to Sit;Sitting - Scoot to Edge of Bed Supine to Sit: 3: Mod assist;HOB elevated Sitting - Scoot to  Edge of Bed: 3: Mod assist Details for Bed Mobility Assistance: (A) to advance LEs off EOB and control trunk to sititng position; pt with multimodal cues for hand placement and sequencing  Transfers Transfers: Sit to Stand;Stand to Sit;Stand Pivot Transfers Sit to Stand: 3: Mod assist;From bed;With upper extremity assist Stand to Sit: 3: Mod assist;To chair/3-in-1;With armrests;With upper extremity assist Stand Pivot Transfers: 2: Max assist Details for Transfer Assistance: pt with increased difficulty advancing LEs to pivot into chair; requires increased (A) to extend hips and bring trunk into upright standing position while maintaining balance; pt demo fear of falling and tends to grab for external objects; max encouragement and cues for transfer  Ambulation/Gait Ambulation/Gait Assistance: Not tested (comment) Stairs: No Wheelchair Mobility Wheelchair Mobility: No    Exercises General Exercises - Lower Extremity Ankle Circles/Pumps: 10 reps   PT Diagnosis: Difficulty walking;Generalized weakness;Acute pain  PT Problem List: Decreased strength;Decreased range of motion;Decreased activity tolerance;Decreased balance;Decreased mobility;Decreased knowledge of use of DME;Decreased cognition;Decreased knowledge of precautions;Decreased safety awareness;Pain PT Treatment Interventions: DME instruction;Gait training;Functional mobility training;Therapeutic activities;Therapeutic exercise;Balance training;Neuromuscular re-education;Patient/family education     PT Goals(Current goals can be found in the care plan section) Acute Rehab PT Goals Patient Stated Goal: to not have back pain PT Goal Formulation: With patient/family Time For Goal Achievement: 05/02/13 Potential to Achieve Goals: Good  Visit Information  Last PT Received On: 04/18/13 Assistance Needed: +2 (for ambulation ) History of Present Illness: Pt is a 79 y.o. female adm secondary to fall and dehydration.        Prior  Functioning  Home Living Family/patient expects to be discharged to:: Skilled nursing facility Living Arrangements: Spouse/significant other Available Help at Discharge: Family;Available 24 hours/day Home Equipment: Walker - 2 wheels;Bedside commode;Shower seat Additional Comments: pt was living with daughter secondary to husband being in CIR here at [facility]; pt with recent surgery at [facility]; had requested to stay at her home for a couple of days independently and fell. was brought in by EMS  Prior Function Level of Independence: Independent with assistive device(s) Comments: ambulating wtih SPC up till admission  (daughter reports pt was independent with ADLs ) Communication Communication: No difficulties Dominant Hand: Right    Cognition  Cognition Arousal/Alertness: Awake/alert Behavior During Therapy: WFL for tasks assessed/performed Overall Cognitive Status: Impaired/Different from baseline Area of Impairment: Following commands;Safety/judgement;Memory Memory: Decreased short-term memory Following Commands: Follows one step commands consistently;Follows one step commands with increased time Safety/Judgement: Decreased awareness of safety;Decreased awareness of deficits General Comments: daughter reports she has noticed decline in cognition over the past few days.     Extremity/Trunk Assessment Upper Extremity Assessment Upper Extremity Assessment: Defer to OT evaluation Lower Extremity Assessment Lower Extremity Assessment: Generalized weakness Cervical / Trunk Assessment Cervical / Trunk Assessment: Kyphotic   Balance Balance Balance Assessed: Yes Static Sitting Balance Static Sitting - Balance Support: Bilateral upper extremity supported;Feet supported Static Sitting - Level of Assistance: 5: Stand by assistance Static Sitting - Comment/# of Minutes: pt tolerated sitting EOB ~5 min; cues for upright posture   End of Session PT - End of Session Equipment Utilized During  Treatment: Gait belt;Oxygen;Other (comment) (pt on 2L O2 ) Activity Tolerance: Patient limited by fatigue;Patient limited by pain Patient left: in chair;with call bell/phone within reach;with chair alarm set;with family/visitor present Nurse Communication: Mobility status  GP     Donell Sievert, [place] [phone] 04/18/2013, 9:08 AM

## 2013-04-18 NOTE — Progress Notes (Signed)
Severe resp distress , now s/p 1 unit prbc  Plan Bedside CCM tMD to go for intubation eval  Dr. Kalman Shan, M.D., [facility].C.P Pulmonary and Critical Care Medicine Staff Physician [facility] [place] Pulmonary and Critical Care Pager: [phone], If no answer or between  15:00h - 7:00h: call [phone]  04/18/2013 9:54 PM

## 2013-04-18 NOTE — H&P (Signed)
Date: 04/18/2013  Patient name: Breanna Guerrero  Medical record number: [id number]  Date of birth: Apr 03, 1928   I have seen and evaluated Gabriel Earing and discussed their care with the Residency Team. I saw Ms. Dement with the team on rounds this morning.  Her daughter was in the room.  Ms. Orris was able to give answers, however, she was frustrated that we kept asking the same questions.  Per report of family and confirmed by Ms. Royle, she has been feeling poorly particularly for the past week.  She has also been having low blood pressures at home.  She was evaluated at least once by EMS and once at an OSH where she received fluids, however, she did not improved.  On the day of admission, her family could not get a hold of her and called EMS.  EMS found her down on the floor, confused, agitated, weak and tremulous, apparently having fallen from the cough.  She had a similar episode of tremulousness, weakness and confusion about 4 days prior, this was when she was evaluated in the ED.  Further history includes increased rectal drainage (she is s/p rectal cancer with diverting look ileostomy on January 07, 2013) along with increased output from the colostomy (4-5 times per day).  The colostomy output today is loose and foul smelling.  Her last antibiotics were during her admission for cancer at [facility].  Further, during the past week, she has had decreased PO intake to both solids and liquids due to feeling like she couldn't swallow, but no pain with swallowing.  Her daughter also reports that she has had a personality change and has been less pleasant.    She initially presented at [facility] and was noted to be tachycardic with a WBC of 22, 30% bands.  She was also noted to be in acute renal failure with an elevated LA of 2.8.  She had a UA which had RBCs in it, thought to be traumatic.  Her CXR was read as no acute process.  Repeat labs done at this facility showed persistent elevated WBC with  bands, LA had normalized and persistent AKI despite volume repletion.  She was also noted to have a declining H/H with an elevated T.bili.   Assessment and Plan: I have seen and evaluated the patient as outlined above. I agree with the formulated Assessment and Plan as detailed in the residents' admission note, with the following changes:   1. SIRS, likely infectious with source currently unknown, likely a GI source - She meets criteria 2/2 bandemia, tachycardia and low blood pressure  - UA/CXR do not show sources of infection - Would do BC X 2, repeat if already done given persistent low BP - Start Zosyn - Cdiff sent, GI pathogen panel ordered - Consider CT of abdomen to evaluate source - Monitor labs at least q 12 given anemia and acute kidney injury noted below.  - Given her low BPs, will hold metoprolol for now  2. AKI - Cr initially 3.7 at OSH, down to 2.4, still above baseline - BUN/Cr ratio < 20, but could be due to prolonged dehydration - Continue IVF - Would calculate a FENA for better differentiation of disease  3. Dehydration - Likely related to decreased PO intake and loose/frequent stools - IVF as need to maintain BP - SLP swallow evaluation  4. Dysphagia - She reports inability to swallow solids and liquids, she did have some PO intake while in house so far -  SLP evaluation  5. Anemia, increased T.bili - H/H dropping, could be related to volume resusciation as she was likely hemoconcentrated, however, with concomitant LFT changes, hemolysis is a concern.  Hemolysis labs have been sent.   6. Code status: Currently full code, discussed with daughter who suggested we discuss with her mother at a later time as she has asked to not be questioned anymore today.   Transfer to SDU for closer monitoring  Other issues per resident note.     [name], MD 11/16/20149:46 AM

## 2013-04-18 NOTE — Consult Note (Signed)
PULMONARY  / CRITICAL CARE MEDICINE  Name: Breanna Guerrero MRN: [id number] DOB: [birth year]    ADMISSION DATE:  04/17/2013 CONSULTATION DATE:  04/18/13  REFERRING MD :  Danise Edge  PRIMARY SERVICE: IMTS   CHIEF COMPLAINT:  Hypotension   BRIEF PATIENT DESCRIPTION: 79 yo WF with recent dx of rectal cancer s/p resection w/ ileostomy 01/2013 at [facility] . Chemo/XRT completed by Dr Truett Perna 10/2012  Transferred from outside hospital for dehydration and acute renal failure . B/P remained low despite fluid challenge, PCCM asked to consult and move to  SDU 11/16 .   SIGNIFICANT EVENTS / STUDIES:  Transfer to ICU 11/16 for hypotension  LINES / TUBES:   CULTURES: 11/15 BC x2 >>] 11/15 Stool Cx >> 11/15 C Diff>>  ANTIBIOTICS: 11/16 Zosyn >>  HISTORY OF PRESENT ILLNESS:    Breanna Guerrero, presented to [facility] yesterday with weakness and confusion . EMS found her down on the floor, confused, agitated, weak and tremulous, Taken to ER found to be dehydrated with acute renal failure , scr ~3.5 She was given 3 l/m bolus of saline and transferred to [facility].  She has a hx of rectal cancer dx in 09/2012 , given chemo/XRT 10/2012 by Dr. Truett Perna. Underwent resection at [facility] 01/2013. W/ ileostomy. Has had a slow recovery. Over last 5 weeks with progressive weakness .  She also notes  increased rectal drainage (  along with increased output from the ostomy (4-5 times per day). Intake and appetite has been very poor over last 5 weeks.  Labs from  [facility] and was noted to be tachycardic with a WBC of 22, 30% bands. LA 2.8. CXR with no acute process. She continued to have low b/p on am  Of 11/16 . Started on fluid bolus and transferred to SDU . PCCM consulted for management.  Scr is trending dow ~2.4 . Pt is alert and f/c . Daughter is at bedside. She denies abd pain, urinary symptoms . Says she has been weak with no appetite . Does have more rectal leakage for last 6 weeks sometimes bloody. Pan cx and  C. Diff are pending.  Last abx ~8/23014       PAST MEDICAL HISTORY :  Past Medical History  Diagnosis Date  . Hypertension   . Stroke May 2013    mild stroke  . Hemorrhoids   . Cancer     Rectal adenocarcinoma   Past Surgical History  Procedure Laterality Date  . Hemorrhoid surgery    . Colonoscopy    . Eus N/A 09/09/2012    Procedure: LOWER ENDOSCOPIC ULTRASOUND (EUS);  Surgeon: Willis Modena, MD;  Location: [facility];  Service: Endoscopy;  Laterality: N/A;  moderate sedation  . Rectal biopsy  09/03/2012    Suspicious Adenocarcinoma   Prior to Admission medications   Medication Sig Start Date End Date Taking? Authorizing Provider  acetaminophen-codeine (TYLENOL #2) 300-15 MG per tablet Take 1 tablet by mouth every 4 (four) hours as needed for moderate pain.   Yes Historical Provider, MD   Allergies  Allergen Reactions  . Sulfa Antibiotics   . Latex Rash    FAMILY HISTORY:  Family History  Problem Relation Age of Onset  . Breast cancer Sister   . Brain cancer Sister   . Leukemia Maternal Aunt    SOCIAL HISTORY:  reports that she has never smoked. She does not have any smokeless tobacco history on file. She reports that she does not drink alcohol or  use illicit drugs.  REVIEW OF SYSTEMS:  Constitutional:   No  , night sweats,  Fevers, chills,  +fatigue, or  lassitude.  HEENT:   No headaches,  Difficulty swallowing,  Tooth/dental problems, or  Sore throat,                No sneezing, itching, ear ache, nasal congestion, post nasal drip,   CV:  No chest pain,  Orthopnea, PND, swelling in lower extremities, anasarca, dizziness, palpitations, syncope.   GI  No heartburn, indigestion, abdominal pain, nausea, vomiting, diarrhea,  +change in bowel habits, loss of appetite, bloody stools.   Resp: No shortness of breath with exertion or at rest.  No excess mucus, no productive cough,  No non-productive cough,  No coughing up of blood.  No change in color of mucus.  No  wheezing.  No chest wall deformity  Skin: no rash or lesions.  GU: no dysuria, change in color of urine, no urgency or frequency.  No flank pain, no hematuria   MS:  No joint pain or swelling.  No decreased range of motion.  No back pain.  Psych:  No change in mood or affect. No depression or anxiety.  No memory loss.       SUBJECTIVE:  Im very weak "   VITAL SIGNS: Temp:  [97.3 F (36.3 C)-98.2 F (36.8 C)] 97.4 F (36.3 C) (11/16 1016) Pulse Rate:  [56-115] 56 (11/16 1016) Resp:  [16-32] 18 (11/16 1016) BP: (82-130)/(42-73) 92/55 mmHg (11/16 1016) SpO2:  [92 %-99 %] 92 % (11/16 1016) Weight:  [55.5 kg (122 lb 5.7 oz)] 55.5 kg (122 lb 5.7 oz) (11/15 2013) HEMODYNAMICS:   VENTILATOR SETTINGS:   INTAKE / OUTPUT: Intake/Output     11/15 0701 - 11/16 0700 11/16 0701 - 11/17 0700   P.O. 120 120   I.V. (mL/kg) 863.3 (15.6) 750 (13.5)   Total Intake(mL/kg) 983.3 (17.7) 870 (15.7)   Urine (mL/kg/hr) 300 150 (0.6)   Stool 330 1 (0)   Total Output 630 151   Net +353.3 +719          PHYSICAL EXAMINATION: General:  Elderly female, frail  Neuro:  Alert/f/c  HEENT:  Dry mucosa  Cardiovascular:  NSR no m/r/g  Lungs:  Diminished BS in bases  Abdomen:  Soft, BS + , ileostomy draining-foul odor  Musculoskeletal:  Intact  Skin:  Pale   LABS:  CBC  Recent Labs Lab 04/18/13 0503  WBC 11.8*  HGB 7.8*  HCT 21.5*  PLT 84*   Coag's No results found for this basename: APTT, INR,  in the last 168 hours BMET  Recent Labs Lab 04/17/13 2107 04/18/13 0503  NA 126* 128*  K 5.7* 4.8  CL 100 101  CO2 13* 12*  BUN 56* 59*  CREATININE 2.22* 2.46*  GLUCOSE 78 121*   Electrolytes  Recent Labs Lab 04/17/13 2107 04/18/13 0503  CALCIUM 8.0* 7.8*  MG 1.6  --   PHOS 3.6  --    Sepsis Markers  Recent Labs Lab 04/17/13 2107  LATICACIDVEN 1.9   ABG No results found for this basename: PHART, PCO2ART, PO2ART,  in the last 168 hours Liver Enzymes  Recent  Labs Lab 04/18/13 0503  AST 26  ALT 28  ALKPHOS 204*  BILITOT 1.7*  ALBUMIN 1.9*   Cardiac Enzymes No results found for this basename: TROPONINI, PROBNP,  in the last 168 hours Glucose No results found for this basename: GLUCAP,  in the  last 168 hours  Imaging Dg Chest Port 1 View  04/17/2013   CLINICAL DATA:  Found on her floor this afternoon, dehydration, elevated white blood cell count, history of cancer, renal insufficiency and hypertension  EXAM: PORTABLE CHEST - 1 VIEW  COMPARISON:  Chest CT -09/04/2012  FINDINGS: Normal cardiac silhouette and mediastinal contours. The lungs appear hyperexpanded. Minimal bibasilar atelectasis. Trace bilateral effusions not excluded. No evidence of pulmonary edema. No pneumothorax. Osteopenia without definite acute osseus abnormality.  IMPRESSION: Bibasilar atelectasis and possible trace bilateral effusions without definite acute cardiopulmonary disease on this AP portable examination. Further evaluation with a PA and lateral chest radiograph may be obtained as clinically indicated.   Electronically Signed   By: Simonne Come M.D.   On: 04/17/2013 18:04     CXR: Bibasilar atelectasis and possible trace bilateral effusions without  definite acute cardiopulmonary disease    ASSESSMENT / PLAN:  PULMONARY A: No known issues  P:   O2 to keep sat >90%  CARDIOVASCULAR A: Hypotension suspect from dehydration/hypovolemia +/- SIRS  LA 1.9   P:  Trend LA  IVF resusciatation  Transfer to SDU  Check card enzymes  D/c metoprolol  Lovenox for DVT-? Change to Hep   RENAL A:  Acute Renal Failure suspected due to dehydration  P:   IVF bolus  Avoid nephrotoxins  Check bmet in am    GASTROINTESTINAL A:  Rectal cancer s/p resection with ileostomy 01/2013 and prev chemo/xrt 10/2012 >worsening rectal drainage, increased ostomy output  ?source of infection  Malnutrition w/ low albumin and anorexia   P:   Check CT abd , no contrast    HEMATOLOGIC A:  Anemia ?chronic or loss from rectal drainage   P:  Transfuse 1 U PRBC for hbg <7   INFECTIOUS A:  ?Early SIRS with elevated WBC /bands and tachycardia >GI source ?  P:   Trend LA  Check PCT  Follow pan cx  Continue Zosyn   c diff pending   ENDOCRINE A:   P:   Monitor   NEUROLOGIC A:  AMS ? Secondary to electrolytes imbalance/dehydration P:   Monitor  Avoid sedating rx   PARRETT,TAMMY NP-C   TODAY'S SUMMARY: With high pct favor severe sepsis (note AKI although lactate nml) , BP has responded to fluids, await CT abdomen for GI source, agree with 1 u PRBC transfusion  I have personally obtained a history, examined the patient, evaluated laboratory and imaging results, formulated the assessment and plan and placed orders. CRITICAL CARE: The patient is critically ill with multiple organ systems failure and requires high complexity decision making for assessment and support, frequent evaluation and titration of therapies, application of advanced monitoring technologies and extensive interpretation of multiple databases. Critical Care Time devoted to patient care services described in this note is 45  minutes.   Oretha Milch  Pulmonary and Critical Care Medicine [facility] Pager: [phone]  04/18/2013, 11:27 AM

## 2013-04-18 NOTE — Progress Notes (Signed)
Report called to "Breanna Guerrero" on 2C. Daughter at bedside,aware of transfer. All belongings gathered and sent with patient.

## 2013-04-18 NOTE — Progress Notes (Addendum)
Subjective: Breanna Guerrero was seen and examined this morning.  Her BP is low despite aggressive fluid resuscitation.  She is sitting up in chair, alert and oriented but slow to respond.  Her daughter says she did eat breakfast this morning.  Objective: Vital signs in last 24 hours: Filed Vitals:   04/17/13 2011 04/17/13 2013 04/18/13 0548 04/18/13 0859  BP:  128/53 130/55 82/49  Pulse:  115 100 94  Temp:  97.8 F (36.6 C) 98 F (36.7 C) 97.9 F (36.6 C)  TempSrc: Temporal Oral Oral   Resp:  25 22 22   Height:  5\' 2"  (1.575 m)    Weight:  55.5 kg (122 lb 5.7 oz)    SpO2:  95% 97% 94%   Weight change:   Intake/Output Summary (Last 24 hours) at 04/18/13 0955 Last data filed at 04/18/13 1610  Gross per 24 hour  Intake 1103.33 ml  Output    781 ml  Net 322.33 ml   General: sitting up in chair in NAD Cardiac: RRR, no rubs, murmurs or gallops Pulm: clear to auscultation bilaterally, moving normal volumes of air Abd: soft, nontender, nondistended, BS present, colostomy bag with greenish/brown soft stool Ext: warm and well perfused, 2+ pretibial edema Neuro: alert and oriented X3, slow verbal responses   Lab Results: Basic Metabolic Panel:  Recent Labs Lab 04/17/13 2107 04/18/13 0503  NA 126* 128*  K 5.7* 4.8  CL 100 101  CO2 13* 12*  GLUCOSE 78 121*  BUN 56* 59*  CREATININE 2.22* 2.46*  CALCIUM 8.0* 7.8*  MG 1.6  --   PHOS 3.6  --    Liver Function Tests:  Recent Labs Lab 04/18/13 0503  AST 26  ALT 28  ALKPHOS 204*  BILITOT 1.7*  PROT 5.0*  ALBUMIN 1.9*   CBC:  Recent Labs Lab 04/18/13 0503  WBC 11.8*  NEUTROABS 11.4*  HGB 7.8*  HCT 21.5*  MCV 77.3*  PLT 84*   Studies/Results: Dg Chest Port 1 View  04/17/2013   CLINICAL DATA:  Found on her floor this afternoon, dehydration, elevated white blood cell count, history of cancer, renal insufficiency and hypertension  EXAM: PORTABLE CHEST - 1 VIEW  COMPARISON:  Chest CT -09/04/2012  FINDINGS: Normal  cardiac silhouette and mediastinal contours. The lungs appear hyperexpanded. Minimal bibasilar atelectasis. Trace bilateral effusions not excluded. No evidence of pulmonary edema. No pneumothorax. Osteopenia without definite acute osseus abnormality.  IMPRESSION: Bibasilar atelectasis and possible trace bilateral effusions without definite acute cardiopulmonary disease on this AP portable examination. Further evaluation with a PA and lateral chest radiograph may be obtained as clinically indicated.   Electronically Signed   By: Simonne Come M.D.   On: 04/17/2013 18:04   Medications: I have reviewed the patient's current medications. Scheduled Meds: . dextrose  1,000 mL Intravenous Once  . enoxaparin (LOVENOX) injection  30 mg Subcutaneous Q24H  . metoprolol tartrate  12.5 mg Oral BID  . piperacillin-tazobactam (ZOSYN)  IV  2.25 g Intravenous Q8H  . sodium chloride  3 mL Intravenous Q12H   Continuous Infusions: . dextrose 5 % and 0.9% NaCl 100 mL/hr at 04/18/13 0654   PRN Meds:.acetaminophen, acetaminophen, ondansetron (ZOFRAN) IV, ondansetron Assessment/Plan: Breanna Guerrero is a 79 y.o. female PMH rectal cancer s/p neoadjuvant chemotherapy, radiation, and low anterior resection and diverting loop ileostomy on 01/07/13, hypertension who presents as a transfer from [facility] with a chief complaint of altered mental status.   #SIRS,  Leukocytosis with  left-shift - No clear infectious source. Patient is afebrile. Chest x-ray is clear, though this is in the setting of dehydration. Normal lung exam without crackles, O2 saturation 95-99%. No signs of skin or soft tissue infection on exam. UA is clean and patient denies dysuria. Surgical site infection/abscess unlikely as surgery was in August. Could be a stress response. Infectious diarrhea possibile given hospitalizations.  C. Diff negative.  Awaiting GI pathogen panel and stool culture.  Patient with 82/49 BP this AM despite volume resuscitation.   Patient meets sepsis criteria for bandemia, tachycardia.  WBC < 12.0 but elevated at 11.8.  Abdomen likely source as patient's daughter reports her mom has complained of abdominal discomfort for the past few weeks, patient with decreased po for the past few days.  Lactic acid normal 1.9.  BP still 92/55 despite several boluses.  - NSS bolus 1L - continuous NSS @ 150cc/hr  - start Zosyn - transfer to SDU   - Continue to monitor vital signs - Blood cultures x 2 pending - stool GI pathogen panel  - CT abd/pelvis - PCCM consulted and following  #Volume depletion - Patient appears dehydrated on exam with decreased skin turgor, dry mucous membranes, sinus tachycardia. She has also had some soft blood pressures documented (90s/50s), though is currently normotensive. Family reports a 5-week history of decreased po intake, worsened over the past few days in the setting on anxiety over husband's health. Her labs from [facility] reinforce volume depletion and chronically decreased po intake, showing low albumin, hyponatremia, and elevated creatinine. - IV fluids as above - close monitoring of BP  #Acute kidney injury - Cr elevated to 3.7 from a baseline of 0.7-0.9. 2.4 today.  Likely pre-renal etiology given volume depletion on exam. UA within normal limits.  - IV fluids as above  - BMP q8h - calculate FeNa  #Elevated anion gap - AG 17 --> 15. Likely multifactorial from mildly elevated lactate (2.8) likely secondary to volume depletion, uremia (BUN 61), possibly starvation ketoacidosis (trace ketones in urine).  - IV fluids as above  - BMP q8h  #Tachycardia - OSH reported atrial flutter and gave her Cardizem, but they did not perform a 12-lead EKG so were unable to send [place] documentation of this. She has a history of post-operative atrial fibrillation. Patient was in sinus tachycardia with rate of 100-115 at admission.  Resolved.   - Telemetry bed  - d/c metoprolol as BPs are low  #Anemia - 9.9  at outside hospital yesterday.  7.8 this morning.  Concern for hemolysis given elevated T. Bili, 1.7 and LDH 281. - haptoglobin pending - smear pending - q8h CBC - FOBT   #Dysphagia - Low concern for aspiration at this time, but per family patient has been experiencing dysphagia and this has perhaps hindered her ability to tolerate po.  - Regular diet   #Tremulousness - Unclear etiology. Patient denies alcohol or benzo use, so withdrawal is unlikely. She is afebrile.  - Checking TSH   #DVT PPX - Lovenox subq.   #Code status - Full code.  Dispo: Disposition is deferred at this time, awaiting improvement of current medical problems.  Anticipated discharge in approximately 2-3 day(s).   The patient does have a current PCP (Rufus Effie Shy III, MD) and does need an [facility] hospital follow-up appointment after discharge.  The patient does not know have transportation limitations that hinder transportation to clinic appointments.  .Services Needed at time of discharge: Y = Yes, Blank = No  PT:   OT:   RN:   Equipment:   Other:     LOS: 1 day   Evelena Peat, DO 04/18/2013, 9:55 AM

## 2013-04-18 NOTE — Significant Event (Signed)
Rapid Response Event Note  Overview: called by bedside nurse, pt. In resp. Distress, O2 sats 70's - 80's. Pt. 1.5hrs in to PRBC that has already been stopped for possible transfusion reaction?      Initial Focused Assessment: MD at bedside, pt. In distress, tachypnic, O2 sats 80's to 90 on VM, tachycardiac, BP stable at this time   Interventions: NRB placed on pt., CXR, ABG, CCM arrived, lasix 40mg  IV, BiPap. Transfusion reaction protocol followed   Event Summary: pt. Resting, VSS, O2 sats 97-100%   at      at          Mallie Darting

## 2013-04-18 NOTE — Progress Notes (Signed)
ANTIBIOTIC CONSULT NOTE - INITIAL  Pharmacy Consult for Zosyn Indication: abdominal pain, empiric  Allergies  Allergen Reactions  . Sulfa Antibiotics   . Latex Rash    Labs:  Recent Labs  04/17/13 2107 04/18/13 0503  WBC  --  11.8*  HGB  --  7.8*  PLT  --  84*  CREATININE 2.22* 2.46*   Estimated Creatinine Clearance: 13.7 ml/min (by C-G formula based on Cr of 2.46). No results found for this basename: VANCOTROUGH, VANCOPEAK, VANCORANDOM, GENTTROUGH, GENTPEAK, GENTRANDOM, TOBRATROUGH, TOBRAPEAK, TOBRARND, AMIKACINPEAK, AMIKACINTROU, AMIKACIN,  in the last 72 hours   Microbiology: No results found for this or any previous visit (from the past 720 hour(s)).  Medical History: Past Medical History  Diagnosis Date  . Hypertension   . Stroke May 2013    mild stroke  . Hemorrhoids   . Cancer     Rectal adenocarcinoma    Assessment: 79 year old female with acute kidney injury beginning Zosyn for empiric coverage  Goal of Therapy:  Appropriate dosing  Plan:  1) Begin Zosyn 2.25 grams iv Q 8 hours 2) Pharmacy will sign off - follow rena function peripherally  Thank you. Okey Regal, PharmD [phone] 04/18/2013,9:42 AM

## 2013-04-18 NOTE — Progress Notes (Addendum)
Daughter calls nurse to room. Pt c/o SOB saying "I can't breath." O2 increased to 4L. De-sats to 80's at times but poor waveform so difficult to tell if this is accurate. RR 30's to low 40's. HR 110's-120's. BP elevated. Attempted to put venti mask on but patient refusing. Immediately stopped blood transfusion.  Pt very anxious. MD notified and coming to bedside.    M.Foster Simpson, RN

## 2013-04-18 NOTE — Progress Notes (Addendum)
Internal Medicine Teaching Service Night Float Progress Note S: Called my nursing staff at ~9:30pm that patient was 1-1.5 hour into transfusion and had become acutely SOB, was desaturating to 70s-80 (poor waveforms) and had RR in high 30s.  Responded to find patient in acute respiratory distress with daughter [name]) at bedside.  Patient denied any CP.  O: BP: 150s/70s RR: 38, O2sat: 72-76  HR 130s Gen: Acute respiratory distress, Agitated , on [place] at 4L Resp: B/L expiratory wheezing, good air movement Cardiac: Tachycardia Neuro: Awake, Oriented to person, responds appropriately, agitated.  A: Acute Respiratory Distress possibly secondary to Transfusion Other etiologies Anxiety/Panic attack per daughter patient has had history of attacks and been under increased anxiety.  Pulmonary Edema from volume overload patient has received IVF since admission and has AKI  P: Discussed with PCCM- Dr. Marchelle Gearing,  Transfusion stopped, fluids kvo. Patient was initially placed on a NRB without much improvement in respiratory status, Respiratory was called and placed patient on BiPAP.  Dr. Belinda Block (Fellow) at bedside. Stat chest xray showed mild pulmonary edema with interstitial prominence. Stat ABG was preformed while on NRB, which showed 7.32/21.9/236/11/97.1 .  Ativan was given initially for agitation.  O2 sats improved on BiPAP to 97%, agitation resolved, respiratory rate decreased to mid 20s and HR decreased to 116.  Will continue to monitor. Discussed possible need for intubation with Daughter, would like to prevent as much as possible but gives permission to proceed if absolutely necessary. Addendum: Gave IV Lasix 40mg   Daughter [name] would like to be notified if changes overnight [phone].   Carlynn Purl, DO 11:17 PM

## 2013-04-19 ENCOUNTER — Inpatient Hospital Stay (HOSPITAL_COMMUNITY): Payer: Medicare Other

## 2013-04-19 DIAGNOSIS — M79609 Pain in unspecified limb: Secondary | ICD-10-CM

## 2013-04-19 DIAGNOSIS — E440 Moderate protein-calorie malnutrition: Secondary | ICD-10-CM | POA: Insufficient documentation

## 2013-04-19 LAB — GI PATHOGEN PANEL BY PCR, STOOL
C difficile toxin A/B: NEGATIVE
E coli (STEC): NEGATIVE
Norovirus GI/GII: NEGATIVE
Rotavirus A by PCR: NEGATIVE
Salmonella by PCR: NEGATIVE
Shigella by PCR: NEGATIVE

## 2013-04-19 LAB — BLOOD GAS, ARTERIAL
Acid-base deficit: 14.1 mmol/L — ABNORMAL HIGH (ref 0.0–2.0)
Bicarbonate: 9.6 mEq/L — ABNORMAL LOW (ref 20.0–24.0)
Delivery systems: POSITIVE
Drawn by: 347621
FIO2: 0.3 %
O2 Saturation: 97 %
PEEP: 5 cmH2O
pCO2 arterial: 15.6 mmHg — CL (ref 35.0–45.0)
pO2, Arterial: 161 mmHg — ABNORMAL HIGH (ref 80.0–100.0)

## 2013-04-19 LAB — ABO/RH: ABO/RH(D): A POS

## 2013-04-19 LAB — CBC WITH DIFFERENTIAL/PLATELET
Basophils Relative: 0 % (ref 0–1)
Eosinophils Absolute: 0 10*3/uL (ref 0.0–0.7)
Eosinophils Relative: 0 % (ref 0–5)
Eosinophils Relative: 0 % (ref 0–5)
Hemoglobin: 8 g/dL — ABNORMAL LOW (ref 12.0–15.0)
Hemoglobin: 7.8 g/dL — ABNORMAL LOW (ref 12.0–15.0)
Lymphs Abs: 0.8 10*3/uL (ref 0.7–4.0)
MCH: 28.2 pg (ref 26.0–34.0)
MCV: 76.8 fL — ABNORMAL LOW (ref 78.0–100.0)
Monocytes Absolute: 1.2 10*3/uL — ABNORMAL HIGH (ref 0.1–1.0)
Monocytes Relative: 6 % (ref 3–12)
Monocytes Relative: 3 % (ref 3–12)
Neutro Abs: 16.8 10*3/uL — ABNORMAL HIGH (ref 1.7–7.7)
Neutrophils Relative %: 90 % — ABNORMAL HIGH (ref 43–77)
Neutrophils Relative %: 94 % — ABNORMAL HIGH (ref 43–77)
Platelets: 74 10*3/uL — ABNORMAL LOW (ref 150–400)
Platelets: 57 10*3/uL — ABNORMAL LOW (ref 150–400)
RBC: 2.84 MIL/uL — ABNORMAL LOW (ref 3.87–5.11)
RBC: 2.75 MIL/uL — ABNORMAL LOW (ref 3.87–5.11)
WBC: 19.7 10*3/uL — ABNORMAL HIGH (ref 4.0–10.5)
WBC: 17.8 10*3/uL — ABNORMAL HIGH (ref 4.0–10.5)

## 2013-04-19 LAB — POCT I-STAT 3, ART BLOOD GAS (G3+)
Acid-base deficit: 14 mmol/L — ABNORMAL HIGH (ref 0.0–2.0)
Bicarbonate: 12.7 mEq/L — ABNORMAL LOW (ref 20.0–24.0)
O2 Saturation: 100 %
Patient temperature: 37.5
TCO2: 14 mmol/L (ref 0–100)
pCO2 arterial: 33.6 mmHg — ABNORMAL LOW (ref 35.0–45.0)

## 2013-04-19 LAB — URINALYSIS W MICROSCOPIC + REFLEX CULTURE
Glucose, UA: NEGATIVE mg/dL
Specific Gravity, Urine: 1.017 (ref 1.005–1.030)
Urobilinogen, UA: 0.2 mg/dL (ref 0.0–1.0)
pH: 5 (ref 5.0–8.0)

## 2013-04-19 LAB — BASIC METABOLIC PANEL
BUN: 54 mg/dL — ABNORMAL HIGH (ref 6–23)
CO2: 9 mEq/L — CL (ref 19–32)
CO2: 12 mEq/L — ABNORMAL LOW (ref 19–32)
Calcium: 7.5 mg/dL — ABNORMAL LOW (ref 8.4–10.5)
Calcium: 7.2 mg/dL — ABNORMAL LOW (ref 8.4–10.5)
Chloride: 104 mEq/L (ref 96–112)
Creatinine, Ser: 2.01 mg/dL — ABNORMAL HIGH (ref 0.50–1.10)
GFR calc Af Amer: 25 mL/min — ABNORMAL LOW (ref >90)
GFR calc non Af Amer: 21 mL/min — ABNORMAL LOW (ref >90)
Potassium: 4.4 mEq/L (ref 3.5–5.1)
Potassium: 4.6 mEq/L (ref 3.5–5.1)
Sodium: 133 mEq/L — ABNORMAL LOW (ref 135–145)

## 2013-04-19 LAB — TYPE AND SCREEN
ABO/RH(D): A POS
Antibody Screen: NEGATIVE

## 2013-04-19 LAB — MRSA PCR SCREENING: MRSA by PCR: NEGATIVE

## 2013-04-19 LAB — TRANSFUSION REACTION: DAT C3: NEGATIVE

## 2013-04-19 LAB — OCCULT BLOOD X 1 CARD TO LAB, STOOL: Fecal Occult Bld: NEGATIVE

## 2013-04-19 LAB — TROPONIN I: Troponin I: <0.3 ng/mL (ref <0.30)

## 2013-04-19 LAB — LACTIC ACID, PLASMA: Lactic Acid, Venous: 1.6 mmol/L (ref 0.5–2.2)

## 2013-04-19 LAB — WET PREP, GENITAL

## 2013-04-19 MED ORDER — MIDAZOLAM HCL 2 MG/2ML IJ SOLN
INTRAMUSCULAR | Status: AC
  Filled 2013-04-19: qty 2

## 2013-04-19 MED ORDER — SODIUM BICARBONATE 8.4 % IV SOLN
50.0000 meq | Freq: Once | INTRAVENOUS | Status: AC
  Administered 2013-04-19: 50 meq via INTRAVENOUS
  Filled 2013-04-19: qty 50

## 2013-04-19 MED ORDER — FUROSEMIDE 10 MG/ML IJ SOLN
40.0000 mg | Freq: Once | INTRAMUSCULAR | Status: AC
  Administered 2013-04-19: 40 mg via INTRAVENOUS

## 2013-04-19 MED ORDER — CLOTRIMAZOLE 1 % VA CREA
1.0000 | TOPICAL_CREAM | Freq: Every day | VAGINAL | Status: DC
  Filled 2013-04-19: qty 45

## 2013-04-19 MED ORDER — AMIODARONE HCL IN DEXTROSE 360-4.14 MG/200ML-% IV SOLN
30.0000 mg/h | INTRAVENOUS | Status: DC
  Filled 2013-04-19 (×2): qty 200

## 2013-04-19 MED ORDER — SODIUM CHLORIDE 0.9 % IV BOLUS (SEPSIS)
250.0000 mL | Freq: Once | INTRAVENOUS | Status: AC
  Administered 2013-04-19: 250 mL via INTRAVENOUS

## 2013-04-19 MED ORDER — SODIUM BICARBONATE 8.4 % IV SOLN
INTRAVENOUS | Status: DC
  Administered 2013-04-19 – 2013-04-20 (×3): via INTRAVENOUS
  Filled 2013-04-19 (×5): qty 150

## 2013-04-19 MED ORDER — FUROSEMIDE 10 MG/ML IJ SOLN
INTRAMUSCULAR | Status: AC
  Filled 2013-04-19: qty 4

## 2013-04-19 MED ORDER — AMIODARONE IV BOLUS ONLY 150 MG/100ML
150.0000 mg | Freq: Once | INTRAVENOUS | Status: AC
  Administered 2013-04-19: 150 mg via INTRAVENOUS
  Filled 2013-04-19: qty 100

## 2013-04-19 MED ORDER — ENSURE COMPLETE PO LIQD
237.0000 mL | Freq: Two times a day (BID) | ORAL | Status: DC
  Administered 2013-04-19 – 2013-04-30 (×17): 237 mL via ORAL

## 2013-04-19 MED ORDER — AMIODARONE HCL IN DEXTROSE 360-4.14 MG/200ML-% IV SOLN: 30.0000 mg/h | INTRAVENOUS | Status: DC

## 2013-04-19 MED ORDER — DILTIAZEM HCL 25 MG/5ML IV SOLN
10.0000 mg | Freq: Once | INTRAVENOUS | Status: DC
  Filled 2013-04-19: qty 5

## 2013-04-19 MED ORDER — PANTOPRAZOLE SODIUM 40 MG IV SOLR
40.0000 mg | INTRAVENOUS | Status: DC
  Administered 2013-04-19: 40 mg via INTRAVENOUS
  Filled 2013-04-19 (×2): qty 40

## 2013-04-19 MED ORDER — SODIUM BICARBONATE 8.4 % IV SOLN
100.0000 meq | Freq: Once | INTRAVENOUS | Status: AC
  Administered 2013-04-19: 100 meq via INTRAVENOUS
  Filled 2013-04-19: qty 100

## 2013-04-19 MED ORDER — AMIODARONE HCL IN DEXTROSE 360-4.14 MG/200ML-% IV SOLN
60.0000 mg/h | INTRAVENOUS | Status: AC
  Administered 2013-04-20: 60 mg/h via INTRAVENOUS
  Filled 2013-04-19 (×2): qty 200

## 2013-04-19 MED ORDER — SODIUM CHLORIDE 0.9 % IV BOLUS (SEPSIS): 500.0000 mL | Freq: Once | INTRAVENOUS | Status: DC

## 2013-04-19 MED ORDER — SODIUM CHLORIDE 0.9 % IV BOLUS (SEPSIS)
1000.0000 mL | Freq: Once | INTRAVENOUS | Status: AC
  Administered 2013-04-19: 1000 mL via INTRAVENOUS

## 2013-04-19 MED ORDER — AMIODARONE HCL IN DEXTROSE 360-4.14 MG/200ML-% IV SOLN: 60.0000 mg/h | INTRAVENOUS | Status: DC

## 2013-04-19 MED ORDER — SODIUM BICARBONATE 8.4 % IV SOLN
INTRAVENOUS | Status: AC
  Filled 2013-04-19: qty 50

## 2013-04-19 NOTE — Consult Note (Addendum)
PULMONARY  / CRITICAL CARE MEDICINE  Name: RHYLIN VENTERS MRN: [id number] DOB: February 26, 1928    ADMISSION DATE:  04/17/2013 CONSULTATION DATE:  04/18/13  REFERRING MD :  Danise Edge  PRIMARY SERVICE: IMTS   CHIEF COMPLAINT:  Hypotension   BRIEF PATIENT DESCRIPTION:  37 F with recent dx of rectal cancer s/p resection w/ ileostomy 01/2013 at [facility] . Chemo/XRT completed by Dr Truett Perna 10/2012. Transferred from outside hospital for hypovolemia and acute renal failure . B/P remained low despite fluid challenge. Moved to ICU and PCCM assumed care 11/16  SIGNIFICANT EVENTS / STUDIES:  11/16 Transfer to ICU for persistent hypotension 11/16 Suspected transfusion reaction with dyspnea requiring NPPV 11/16 CT abd:  post surgical changes, incompletely healed fracture of R sacrum and pubic symphysis  11/17 AFRVR > amiodarone 11/17 asymmetric LE edema > LE venous Dopplers:  11/17 Abn LFTs > RUQ [place]:    LINES / TUBES:   CULTURES: 11/15 C Diff>> NEG 11/16 BC x2 >>  11/16 Urine >>    ANTIBIOTICS: 11/16 Zosyn >>  SUBJECTIVE:   No complaints this morning.  O2 is off.     VITAL SIGNS: Temp:  [97.2 F (36.2 C)-101.2 F (38.4 C)] 98.2 F (36.8 C) (11/17 0200) Pulse Rate:  [56-137] 112 (11/17 0845) Resp:  [18-43] 30 (11/17 0845) BP: (59-156)/(40-102) 107/68 mmHg (11/17 0845) SpO2:  [78 %-100 %] 100 % (11/17 0845) FiO2 (%):  [30 %-40 %] 30 % (11/17 0002) HEMODYNAMICS:   VENTILATOR SETTINGS: Vent Mode:  [-]  FiO2 (%):  [30 %-40 %] 30 % INTAKE / OUTPUT: Intake/Output     11/16 0701 - 11/17 0700 11/17 0701 - 11/18 0700   P.O. 120    I.V. (mL/kg) 1500 (27) 150 (2.7)   Blood 12.5    IV Piggyback 1150    Total Intake(mL/kg) 2782.5 (50.1) 150 (2.7)   Urine (mL/kg/hr) 1285 (1)    Stool 101 (0.1) 200 (1.8)   Total Output 1386 200   Net +1396.5 -50          PHYSICAL EXAMINATION: General:  Elderly female, frail, alert, tachypneic Neuro:  Alert, following commands, moves extremities x 4  HEENT:  MM  pink and moist  Cardiovascular:  Afib with RVR no m/r/g  Lungs:  Diminished BS in bases, no crackles or rhonchi  Abdomen:  Soft,  Hyperactive BS +, ostomy site is viable, no induration.  Mild generalized tenderness Musculoskeletal:  BLE edema R>L Skin:  Pale     LABS: I have reviewed all of today's lab results. Relevant abnormalities are discussed in the A/P section   CXR: NNF     ASSESSMENT / PLAN:  PULMONARY A:  Tachypnea due to met acidosis P:   Cont supplemental O2 Cont PRN NPPV HCO3 infusion ordered  CARDIOVASCULAR A:  Hypotension - suspect hypovolemic New onset AF with RVR (11/17) RLE swelling - r/o DVT P:  Trop negative Begin amiodarone 11/17 BLE dopplers r/o DVT  RENAL A:   AKI, nonoliguric Mixed met acidosis P:   Change to bicarb gtt at 75 mL/hr Monitor BMET intermittently Correct electrolytes as indicated   GASTROINTESTINAL A: S/P resection of rectal Ca with ileostomy 8/14 High ostomy output Prot-cal malnutrition w/ low albumin and anorexia  P:   SUP: IV PPI (indication - thrombocytopenia) Advance diet as tolerated RUQ ultrasound 11/17  HEMATOLOGIC A:   Anemia without overt bleeding Thrombocytopenia P:  Monitor CBC intermittently Hgb goal >7 SCDs prophylaxis HITT panel DIC panel  INFECTIOUS A:  Severe sepsis - unknown source  Markedly elevated PCT Leukocytosis with left shift P:   Micro and abx as above Follow PCT per protocol  ENDOCRINE A:   Mild hyperglycemia P:   Monitor glu on chem profiles Begin SSI for glu > 180  NEUROLOGIC A:  Encephalopathy, resolved Physical deconditioning P:   Monitor      [employer] Physician Assistant Student 04/19/13, 10:20 AM  TODAY'S SUMMARY: Concerns for infection with possible GI source.  CT abdomen unimpressive.  Will get RUQ ultrasound as alk phos and bili elevated.  Will continue with current treatment with Zosyn which will give good aerobic and  anaerobic coverage.  Will continue to follow WBCs with dif to watch left shift.  Pt also thrombocytopenic.  Have stopped heparin for now.  Pt also has recent hospitalizations.  Will check HITT panel and DIC panel.  Also will go ahead and check dopplers as patient has R>L lower extremity edema.       PCCM ATTENDING: I have interviewed and examined the patient and reviewed the database. I have formulated the assessment and plan as reflected in the note above with amendments made by me.   Elevated PCT and L shift on WBC count are worrisome for severe sepsis but source of suspected infection is not clear. Obvious concern would be an abdominal source given recent surgery but CT abd was not impressive. LFTs abn. Therefore, will check RUQ [place]. Cont empiric abx as above  Billy Fischer, MD;  PCCM service; Mobile [phone]

## 2013-04-19 NOTE — Progress Notes (Addendum)
Rehab Admissions Coordinator Note:  Patient was screened by Trish Mage for appropriateness for an Inpatient Acute Rehab Consult.  Noted PT recommending CIR.  At this time, we are recommending Inpatient Rehab consult.  Trish Mage 04/19/2013, 9:24 AM  I can be reached at [phone].  Addendum:  Patient has AARP and they are unlikely to pay for acute inpatient rehab stay.  May want to pursue SNF.

## 2013-04-19 NOTE — Progress Notes (Signed)
Called to bedside twice this evening.  The first time was around 10:30pm.  At that time the patient was about 1 and 1/2 hours into a blood transfusion.  She developed respiratory distress and hypoxia on nasal canula.  She was placed on a non-rebreather.  A blood gas was drawn that was 7.32/21.9/236 on the non-rebreather.  She was placed on bipap 5/5 which helped with her work of breathing.  Her saturations improved and her FiO2 was able to be weaned down to 30%.  Around 1am the patient went into a-fib with RVR with a rate in the 140s to 150s.  Her blood pressures were in the 70s to 80s systolic.  She was given 300mg  amiodarone and HR improved to to 100s.  She was then moved to 36M for closer monitoring.  She will be transferred to the critical care service.

## 2013-04-19 NOTE — Progress Notes (Signed)
Pt. Called complained of hard to breath respiratory in and assessed pt. Pt. Breathing labored tachypneic RR-36-40 per min with abdominal  Retraction, breath sound diminished with fair air movement. Dr. Vassie Loll notified pt. was placed on bipap and stat blood gas and chest xray stat was ordered and done. Blood gas resulted and Dr. Vassie Loll aware awaiting for chest x-ray result. Lasix 40mg . IV was given. Will cont. to monitor.

## 2013-04-19 NOTE — Progress Notes (Signed)
Patient is tachpneic and tachycardic in afib. Upon giving bedside report patient had 2.61 sec pause. Respiratory in to see patient and advise bipap. Patient refuses to wear mask. CCM notified of patient condition. New orders placed. Will continue to monitor.

## 2013-04-19 NOTE — Evaluation (Signed)
Occupational Therapy Evaluation Patient Details Name: GLENDON FISER MRN: [id number] DOB: 1928/01/15 Today's Date: 04/19/2013 Time: 4098-1191 OT Time Calculation (min): 40 min  OT Assessment / Plan / Recommendation History of present illness Pt is a 79 y.o. female adm secondary to fall and dehydration. Found to have 2 remote fractures by CT 04/18/2013 ( Incompletely healed fractures of the right side of the symphysis pubis and right sacral ala)--daughter reports that fractures are probably 2-2 1/2 months old.   Clinical Impression   This 79 yo female admitted with above presents to acute OT with deficits below with pain from remote fractures impeding pt's mobility thus affecting her PLOF at an I/Mod I level. Would benefit from acute OT with follow up on CIR.    OT Assessment  Patient needs continued OT Services    Follow Up Recommendations  CIR    Barriers to Discharge Decreased caregiver support    Equipment Recommendations   (TBD next venue)       Frequency  Min 2X/week    Precautions / Restrictions Precautions Precautions: Fall Precaution Comments: recent fall at home Restrictions Weight Bearing Restrictions: No   Pertinent Vitals/Pain 6/10 faces--pt not able to give me a number; movement was when grimace was noted    ADL  Eating/Feeding: +1 Total assistance (pt not eating unles daugher feeds her) Where Assessed - Eating/Feeding: Bed level Grooming: Maximal assistance Where Assessed - Grooming: Supported sitting Upper Body Bathing: Maximal assistance Where Assessed - Upper Body Bathing: Supported sitting Lower Body Bathing: +1 Total assistance (with additional +1 for standing) Where Assessed - Lower Body Bathing: Supported sit to stand Upper Body Dressing: +1 Total assistance Where Assessed - Upper Body Dressing: Supported sitting Lower Body Dressing: +1 Total assistance (with additional +1 for standing) Where Assessed - Lower Body Dressing: Supported sit to  stand;Supine, head of bed up Toilet Transfer: Maximal assistance Toilet Transfer Method: Stand pivot (to pt's right) Acupuncturist:  (bed to recliner) Toileting - Clothing Manipulation and Hygiene: +1 Total assistance (with addtional +1 for standing) Where Assessed - Toileting Clothing Manipulation and Hygiene: Standing Transfers/Ambulation Related to ADLs: max A stand pivot to her right    OT Diagnosis: Generalized weakness;Cognitive deficits;Acute pain  OT Problem List: Decreased strength;Decreased range of motion;Decreased activity tolerance;Impaired balance (sitting and/or standing);Decreased cognition;Pain;Decreased knowledge of use of DME or AE OT Treatment Interventions: Self-care/ADL training;Balance training;DME and/or AE instruction;Patient/family education;Therapeutic activities   OT Goals(Current goals can be found in the care plan section) Acute Rehab OT Goals OT Goal Formulation: With family Time For Goal Achievement: 05/03/13 Potential to Achieve Goals: Good  Visit Information  Last OT Received On: 04/19/13 Assistance Needed: +2 History of Present Illness: Pt is a 79 y.o. female adm secondary to fall and dehydration. Found to have 2 remote fractures by CT 04/18/2013 ( Incompletely healed fractures of the right side of the symphysis pubis and right sacral ala)       Prior Functioning     Home Living Family/patient expects to be discharged to:: Inpatient rehab Living Arrangements: Spouse/significant other Available Help at Discharge: Family;Available 24 hours/day Home Equipment: Walker - 2 wheels;Bedside commode;Shower seat Additional Comments: pt was living with daughter secondary to husband being in CIR here at [facility]; pt with recent surgery at [facility]; had requested to stay at her home for a couple of days independently and fell. was brought in by EMS  Prior Function Level of Independence: Independent with assistive device(s) Comments: ambulating wtih SPC  up  till admission  Communication Communication: No difficulties Dominant Hand: Right         Vision/Perception Vision - History Patient Visual Report: No change from baseline   Cognition  Cognition Arousal/Alertness: Awake/alert Behavior During Therapy: WFL for tasks assessed/performed Overall Cognitive Status: Impaired/Different from baseline Area of Impairment: Following commands;Safety/judgement Following Commands: Follows one step commands with increased time Safety/Judgement: Decreased awareness of safety;Decreased awareness of deficits    Extremity/Trunk Assessment Upper Extremity Assessment Upper Extremity Assessment: Generalized weakness     Mobility Bed Mobility Bed Mobility: Supine to Sit;Sitting - Scoot to Edge of Bed Supine to Sit: 3: Mod assist;HOB elevated Sitting - Scoot to Edge of Bed: 3: Mod assist Details for Bed Mobility Assistance: (A) to advance LEs off EOB and control trunk to sititng position; pt with multimodal cues for hand placement and sequencing  Transfers Transfers: Sit to Stand;Stand to Sit Sit to Stand: 2: Max assist;Without upper extremity assist;From bed Stand to Sit: 2: Max assist;Without upper extremity assist;To chair/3-in-1 Details for Transfer Assistance: pt with increased difficulty advancing LEs to pivot into chair; poserior lean. When went to stand she put her RUE on my arm as well and held onto my arms for transfer        Balance Balance Balance Assessed: Yes Static Sitting Balance Static Sitting - Balance Support: Bilateral upper extremity supported;Feet supported Static Sitting - Level of Assistance:  (min guard A) Static Sitting - Comment/# of Minutes: 5 minutes   End of Session OT - End of Session Equipment Utilized During Treatment: Gait belt Activity Tolerance: Patient limited by fatigue;Patient limited by pain Patient left: in chair;with family/visitor present Nurse Communication: Mobility status (+2 A stand pivot)        Evette Georges [phone] 04/19/2013, 10:10 AM

## 2013-04-19 NOTE — Progress Notes (Signed)
SLP Cancellation Note  Patient Details Name: Breanna Guerrero MRN: [id number] DOB: 1928/02/16   Cancelled treatment:       Reason Eval/Treat Not Completed: Patient at procedure or test/unavailable. Pt is NPO for an abdominal ultrasound. Not appropriate for swallow eval today. Will f/u tomorrow.    Travante Knee, Riley Nearing 04/19/2013, 11:14 AM

## 2013-04-19 NOTE — Progress Notes (Addendum)
Report called to Amy and pt transferred to 3M07.  M.Foster Simpson, RN

## 2013-04-19 NOTE — Progress Notes (Signed)
INITIAL NUTRITION ASSESSMENT  Pt meets criteria for MODERATE (NON-SEVERE) MALNUTRITION in the context of chronic illness as evidenced by mild-moderate muscle wasting, intake <75% of her needs for >/= 1 month, and edema.  DOCUMENTATION CODES Per approved criteria  -Non-severe (moderate) malnutrition in the context of chronic illness   INTERVENTION:  1. Ensure Complete po BID, each supplement provides 350 kcal and 13 grams of protein.  NUTRITION DIAGNOSIS: Inadequate oral intake related to decreased appetite as evidenced by Meal Completion: <25%.   Goal: Pt to meet >/= 90% of their estimated nutrition needs   Monitor:  PO intake, supplement acceptance, weight trend, labs  Reason for Assessment: Pt identified as at nutrition risk on the Malnutrition Screen Tool  79 y.o. female  Admitting Dx: Severe sepsis(995.92)  ASSESSMENT: PMH rectal cancer s/p neoadjuvant chemotherapy, radiation, and low anterior resection and diverting loop ileostomy on 01/07/13, hypertension who presents as a transfer from [facility] with a chief complaint of altered mental status. Her son-in-law Brett Canales was with her and helped provide the history. He is a radiologist here at [facility] and requested she be brought here for care.  Pt very sleepy this morning. Pt had trouble answering questions verbally seemed to get very tired easily. Pt fell back asleep during physical exam. Pt endorses weight loss and decreased appetite. Per H&P pt has had a poor appetite x 5 weeks. Pt discussed during ICU rounds and with RN. Per staff pt's husband is in rehab and was helping in his care and not taking care of herself. Weight loss may be masked by edema.   Nutrition Focused Physical Exam:  Subcutaneous Fat:  Orbital Region: WNL Upper Arm Region: WNL Thoracic and Lumbar Region: WNL  Muscle:  Temple Region: mild-moderate wasting Clavicle Bone Region: mild-moderate wasting Clavicle and Acromion Bone Region: mild-moderate  wasting Scapular Bone Region: mild-moderate wastign Dorsal Hand: WNL Patellar Region: WNL Anterior Thigh Region: WNL Posterior Calf Region: WNL  Edema: 2+   Height: Ht Readings from Last 1 Encounters:  04/17/13 5\' 2"  (1.575 m)    Weight: Wt Readings from Last 1 Encounters:  04/17/13 122 lb 5.7 oz (55.5 kg)    Ideal Body Weight: 50 kg   % Ideal Body Weight: 111%  Wt Readings from Last 10 Encounters:  04/17/13 122 lb 5.7 oz (55.5 kg)  03/08/13 115 lb 12.8 oz (52.527 kg)  10/30/12 125 lb 14.4 oz (57.108 kg)  10/29/12 125 lb 3.2 oz (56.79 kg)  10/23/12 125 lb 11.2 oz (57.017 kg)  10/16/12 126 lb 9.6 oz (57.425 kg)  10/09/12 126 lb 14.4 oz (57.561 kg)  10/08/12 125 lb 14.4 oz (57.108 kg)  10/05/12 128 lb 1.6 oz (58.106 kg)  09/15/12 123 lb (55.792 kg)    Usual Body Weight: 125 lb  % Usual Body Weight: 98%  BMI:  Body mass index is 22.37 kg/(m^2).  Estimated Nutritional Needs: Kcal: 1400-1600 Protein: 65-80 grams Fluid: >1.4 L/day  Skin: no issues noted  Diet Order: General Meal Completion: 20%   EDUCATION NEEDS: -No education needs identified at this time   Intake/Output Summary (Last 24 hours) at 04/19/13 0912 Last data filed at 04/19/13 0900  Gross per 24 hour  Intake 2962.5 ml  Output   1435 ml  Net 1527.5 ml    Last BM: ileostomy 330 ml 11/15 101 ml 11/16   Labs:   Recent Labs Lab 04/17/13 2107  04/18/13 1410 04/19/13 0100 04/19/13 0357  NA 126*  < > 129* 130* 133*  K 5.7*  < > 4.9 4.4 4.6  CL 100  < > 103 104 107  CO2 13*  < > 13* 9* 12*  BUN 56*  < > 54* 55* 54*  CREATININE 2.22*  < > 2.20* 2.01* 2.05*  CALCIUM 8.0*  < > 7.3* 7.5* 7.2*  MG 1.6  --   --   --   --   PHOS 3.6  --   --   --   --   GLUCOSE 78  < > 192* 128* 140*  < > = values in this interval not displayed.  CBG (last 3)  No results found for this basename: GLUCAP,  in the last 72 hours  Scheduled Meds: . dextrose  1,000 mL Intravenous Once  . furosemide       . heparin subcutaneous  5,000 Units Subcutaneous Q8H  . LORazepam      . midazolam      . piperacillin-tazobactam (ZOSYN)  IV  2.25 g Intravenous Q8H  . sodium chloride  500 mL Intravenous Once  . sodium chloride  3 mL Intravenous Q12H    Continuous Infusions: . sodium chloride 150 mL/hr at 04/19/13 0900    Past Medical History  Diagnosis Date  . Hypertension   . Stroke May 2013    mild stroke  . Hemorrhoids   . Cancer     Rectal adenocarcinoma    Past Surgical History  Procedure Laterality Date  . Hemorrhoid surgery    . Colonoscopy    . Eus N/A 09/09/2012    Procedure: LOWER ENDOSCOPIC ULTRASOUND (EUS);  Surgeon: Willis Modena, MD;  Location: [facility];  Service: Endoscopy;  Laterality: N/A;  moderate sedation  . Rectal biopsy  09/03/2012    Suspicious Adenocarcinoma    Kendell Bane RD, LDN, CNSC [phone] Pager [phone] After Hours Pager

## 2013-04-19 NOTE — Progress Notes (Signed)
*  PRELIMINARY RESULTS* Vascular Ultrasound Lower extremity venous duplex has been completed.  Preliminary findings: no obvious evidence of DVT.   Farrel Demark, RDMS, RVT  04/19/2013, 1:18 PM

## 2013-04-19 NOTE — Progress Notes (Signed)
Dr. Belinda Block, critical care, at bedside. 1L bolus ordered in infusing. 300mg  amio IV given.

## 2013-04-19 NOTE — Progress Notes (Signed)
Internal Medicine Teaching Service Night Float Progress Note  S: Called by nursing that patient tachycardic to 140s-150s.  Afbrile, and starting increased work of breathing while on BiPAP. Patient does have history of post op Afib.  O: HR 148, RR 28, T:101.2 (rectal), BP 76/44 Gen: On BiPAP Resp: Card: Tachycardic Neuro: Arouseable, follows commands EKG: A- fib with RVR  A: A Fib with RVR Sepsis with unclear etiology  P: -Called PCCM- spoke with Dr. Darrick Penna, Dr Belinda Block (fellow) sent to bedside -Transfusion labs pending - Repeat ABG   - U/A, Urine Culture - Bolus 1L IVF - CBC, BMP, Troponin, Lactic acid stat -  Amiodarone - Transfer to ICU

## 2013-04-19 NOTE — Progress Notes (Signed)
Amiodarone 300 mg IV administered stat as ordered. MD at bedside

## 2013-04-19 NOTE — Progress Notes (Signed)
Pt on Bipap and resting comfortably. VSS. Will continue to monitor.   M.Foster Simpson, RN

## 2013-04-19 NOTE — Progress Notes (Signed)
Pt HR high 140's-150's. Increased work of breathing with RR in 30's. Hypotensive. Rectal temp 101.2. EKG done and pt in afib with RVR. Decreased level of consciousness. MD notified and coming to bedside. Critical care notified as well.  M.Foster Simpson, RN

## 2013-04-19 NOTE — Progress Notes (Signed)
Daughter, [name] has been notified of pt's transfer to ICU

## 2013-04-19 NOTE — Progress Notes (Signed)
MD made aware of patient having malodorous, blood tinged discharge from vagina with labial swelling. New orders given.

## 2013-04-19 NOTE — Progress Notes (Signed)
PT Cancellation Note  Patient Details Name: ALEEYAH BENSEN MRN: [id number] DOB: 1927-06-13   Cancelled Treatment:     Pt session cancelled per pt's daughters request.  Pt asleep at this time.  Daughter states pt has not had any sleep in 4 days & is scheduled to go for a ultrasound later today.  Will f/u tomorrow.      Verdell Face, [place] [phone] 04/19/2013

## 2013-04-19 NOTE — Progress Notes (Signed)
eLink Physician-Brief Progress Note Patient Name: RETHER RISON DOB: 06-01-1928 MRN: [id number]  Date of Service  04/19/2013   HPI/Events of Note   New onset AF-RVR  eICU Interventions  Amio gtt -bolus given earlier   Intervention Category Major Interventions: Arrhythmia - evaluation and management  ALVA,RAKESH V. 04/19/2013, 7:51 PM

## 2013-04-19 NOTE — Progress Notes (Signed)
Pt has received two boluses and SBP is still running 70's - 80's. Called eLink and left a message for MD to called back to readdress BP. MD called back and ordered bolus.

## 2013-04-19 NOTE — Progress Notes (Signed)
Received pt from transferring RN.  Called ELink due to pt's low BP and increased HR.  Spoke to MD who gave orders for bolus.

## 2013-04-19 NOTE — Progress Notes (Signed)
eLink Physician-Brief Progress Note Patient Name: Breanna Guerrero DOB: 22-Oct-1927 MRN: [id number]  Date of Service  04/19/2013   HPI/Events of Note     eICU Interventions  Lasix 40 IV given acutely ABG - metab acidosis CXR - no edema Resume bicarb gtt, 1 amp IV stat   Intervention Category Intermediate Interventions: Respiratory distress - evaluation and management  Cairo Lingenfelter V. 04/19/2013, 10:08 PM

## 2013-04-20 ENCOUNTER — Inpatient Hospital Stay (HOSPITAL_COMMUNITY): Payer: Medicare Other

## 2013-04-20 DIAGNOSIS — J9600 Acute respiratory failure, unspecified whether with hypoxia or hypercapnia: Secondary | ICD-10-CM

## 2013-04-20 DIAGNOSIS — J810 Acute pulmonary edema: Secondary | ICD-10-CM

## 2013-04-20 DIAGNOSIS — J189 Pneumonia, unspecified organism: Secondary | ICD-10-CM

## 2013-04-20 DIAGNOSIS — N179 Acute kidney failure, unspecified: Secondary | ICD-10-CM

## 2013-04-20 LAB — CBC WITH DIFFERENTIAL/PLATELET
Basophils Absolute: 0 10*3/uL (ref 0.0–0.1)
Basophils Relative: 0 % (ref 0–1)
Eosinophils Relative: 0 % (ref 0–5)
HCT: 24 % — ABNORMAL LOW (ref 36.0–46.0)
Hemoglobin: 8.7 g/dL — ABNORMAL LOW (ref 12.0–15.0)
MCHC: 36.3 g/dL — ABNORMAL HIGH (ref 30.0–36.0)
MCV: 75 fL — ABNORMAL LOW (ref 78.0–100.0)
Monocytes Absolute: 0.2 10*3/uL (ref 0.1–1.0)
Monocytes Relative: 2 % — ABNORMAL LOW (ref 3–12)
RDW: 15.9 % — ABNORMAL HIGH (ref 11.5–15.5)

## 2013-04-20 LAB — COMPREHENSIVE METABOLIC PANEL
ALT: 38 U/L — ABNORMAL HIGH (ref 0–35)
AST: 36 U/L (ref 0–37)
Alkaline Phosphatase: 352 U/L — ABNORMAL HIGH (ref 39–117)
CO2: 19 mEq/L (ref 19–32)
Calcium: 7.8 mg/dL — ABNORMAL LOW (ref 8.4–10.5)
Chloride: 103 mEq/L (ref 96–112)
Creatinine, Ser: 1.79 mg/dL — ABNORMAL HIGH (ref 0.50–1.10)
GFR calc Af Amer: 29 mL/min — ABNORMAL LOW (ref >90)
GFR calc non Af Amer: 25 mL/min — ABNORMAL LOW (ref >90)
Potassium: 3.3 mEq/L — ABNORMAL LOW (ref 3.5–5.1)
Sodium: 139 mEq/L (ref 135–145)
Total Bilirubin: 3.7 mg/dL — ABNORMAL HIGH (ref 0.3–1.2)

## 2013-04-20 LAB — DIC (DISSEMINATED INTRAVASCULAR COAGULATION)PANEL
Platelets: 39 10*3/uL — ABNORMAL LOW (ref 150–400)
Smear Review: NONE SEEN

## 2013-04-20 LAB — POCT I-STAT 3, ART BLOOD GAS (G3+)
Acid-base deficit: 1 mmol/L (ref 0.0–2.0)
Acid-base deficit: 4 mmol/L — ABNORMAL HIGH (ref 0.0–2.0)
Bicarbonate: 21.4 mEq/L (ref 20.0–24.0)
Bicarbonate: 21 mEq/L (ref 20.0–24.0)
O2 Saturation: 100 %
O2 Saturation: 96 %
Patient temperature: 37.3
Patient temperature: 37.6
TCO2: 22 mmol/L (ref 0–100)
pH, Arterial: 7.526 — ABNORMAL HIGH (ref 7.350–7.450)
pO2, Arterial: 207 mmHg — ABNORMAL HIGH (ref 80.0–100.0)
pO2, Arterial: 86 mmHg (ref 80.0–100.0)

## 2013-04-20 LAB — URINE CULTURE: Culture: NO GROWTH

## 2013-04-20 LAB — BASIC METABOLIC PANEL
BUN: 9 mg/dL (ref 6–23)
Chloride: 111 mEq/L (ref 96–112)
Creatinine, Ser: 0.81 mg/dL (ref 0.50–1.10)
GFR calc Af Amer: 76 mL/min — ABNORMAL LOW (ref >90)
Glucose, Bld: 76 mg/dL (ref 70–99)
Potassium: 3.8 mEq/L (ref 3.5–5.1)
Sodium: 144 mEq/L (ref 135–145)

## 2013-04-20 LAB — DIC (DISSEMINATED INTRAVASCULAR COAGULATION) PANEL
INR: 1.24 (ref 0.00–1.49)
aPTT: 29 seconds (ref 24–37)

## 2013-04-20 LAB — STREP PNEUMONIAE URINARY ANTIGEN: Strep Pneumo Urinary Antigen: NEGATIVE

## 2013-04-20 MED ORDER — SODIUM CHLORIDE 0.9 % IV BOLUS (SEPSIS)
250.0000 mL | Freq: Once | INTRAVENOUS | Status: AC
  Administered 2013-04-20: 250 mL via INTRAVENOUS

## 2013-04-20 MED ORDER — FUROSEMIDE 10 MG/ML IJ SOLN
40.0000 mg | Freq: Once | INTRAMUSCULAR | Status: AC
  Administered 2013-04-20: 40 mg via INTRAVENOUS
  Filled 2013-04-20: qty 4

## 2013-04-20 MED ORDER — SODIUM CHLORIDE 0.9 % IV SOLN
INTRAVENOUS | Status: DC
  Administered 2013-04-20: 20 mL/h via INTRAVENOUS
  Administered 2013-04-28: 22:00:00 via INTRAVENOUS

## 2013-04-20 MED ORDER — WHITE PETROLATUM GEL
Status: AC
  Administered 2013-04-20: 03:00:00
  Filled 2013-04-20: qty 5

## 2013-04-20 MED ORDER — PANTOPRAZOLE SODIUM 40 MG IV SOLR
40.0000 mg | Freq: Two times a day (BID) | INTRAVENOUS | Status: DC
  Administered 2013-04-20 – 2013-04-28 (×18): 40 mg via INTRAVENOUS
  Filled 2013-04-20 (×22): qty 40

## 2013-04-20 MED ORDER — AMIODARONE HCL IN DEXTROSE 360-4.14 MG/200ML-% IV SOLN
30.0000 mg/h | INTRAVENOUS | Status: DC
  Administered 2013-04-20: 30 mg/h via INTRAVENOUS
  Administered 2013-04-20: 60 mg/h via INTRAVENOUS
  Filled 2013-04-20: qty 200

## 2013-04-20 MED ORDER — VANCOMYCIN HCL 1000 MG IV SOLR
750.0000 mg | INTRAVENOUS | Status: DC
  Administered 2013-04-20: 750 mg via INTRAVENOUS
  Filled 2013-04-20 (×2): qty 750

## 2013-04-20 MED ORDER — FLUCONAZOLE IN SODIUM CHLORIDE 200-0.9 MG/100ML-% IV SOLN
200.0000 mg | INTRAVENOUS | Status: AC
  Administered 2013-04-20 – 2013-04-23 (×4): 200 mg via INTRAVENOUS
  Filled 2013-04-20 (×6): qty 100

## 2013-04-20 NOTE — Progress Notes (Signed)
UR completed.  Toniqua Melamed, RN BSN [facility] CCM Trauma/Neuro ICU Case Manager [phone]  

## 2013-04-20 NOTE — Progress Notes (Signed)
RT removed patient from bipap per ABG results. RT placed patienton 3lpm [place].  RT will continue to monitor.

## 2013-04-20 NOTE — Progress Notes (Signed)
SLP Cancellation Note  Patient Details Name: Breanna Guerrero MRN: [id number] DOB: Dec 03, 1927   Cancelled treatment:       Reason Eval/Treat Not Completed: Medical issues which prohibited therapy. Pt with high respiratory rate today. Plan is for her to be on and off BiPAP with limited amounts of water in between. MD concerned for silent aspiration but SLP not to assess pt until respiratory function improves. Will check pt for readiness tomorrow. Discussed oral care prior to water with RN.   Harlon Ditty, [place] CCC-SLP [phone]  [name] 04/20/2013, 9:59 AM

## 2013-04-20 NOTE — Progress Notes (Signed)
PULMONARY  / CRITICAL CARE MEDICINE  Name: Breanna Guerrero MRN: [id number] DOB: [birth year]    SUBJECTIVE:  Asked by eMD to evaluate for possible intubation  PHYSICAL EXAMINATION: Temp:  [97.3 F (36.3 C)-100.9 F (38.3 C)] 100.4 F (38 C) (11/18 1800) Pulse Rate:  [56-139] 127 (11/18 2030) Resp:  [19-51] 36 (11/18 2030) BP: (80-154)/(41-96) 90/41 mmHg (11/18 2030) SpO2:  [72 %-100 %] 96 % (11/18 2030) FiO2 (%):  [40 %-50 %] 40 % (11/18 1800)  CVP: 12 I/O: + 1930  General:  Increased work of breathing, tachypnea Neuro:  Awake, alert, oriented HEENT:  PERRL, moist membranes, JVD Cardiovascular:  Regular, no murmurs Lungs:  Bilateral diminished air entry, diffuse rales Abdomen:  Soft, non tender, bowls sounds present Musculoskeletal:  Anasarca Skin:  Intact  LABS:  Recent Labs Lab 04/17/13 2107  04/18/13 0503 04/18/13 1230 04/18/13 1300 04/18/13 1410  04/19/13 0100 04/19/13 0357 04/19/13 2151 04/20/13 0330 04/20/13 0956 04/20/13 1751 04/20/13 1820  HGB  --   < > 7.8*  --   --  7.0*  --  8.0* 7.8*  --   --  8.7*  --   --   WBC  --   < > 11.8*  --   --  20.8*  --  19.7* 17.8*  --   --  12.1*  --   --   PLT  --   < > 84*  --   --  76*  --  74* 57*  --   --  39*  46*  --   --   NA 126*  --  128*  --   --  129*  --  130* 133*  --   --  139  --  144  K 5.7*  --  4.8  --   --  4.9  --  4.4 4.6  --   --  3.3*  --  3.8  CL 100  --  101  --   --  103  --  104 107  --   --  103  --  111  CO2 13*  --  12*  --   --  13*  --  9* 12*  --   --  19  --  23  GLUCOSE 78  --  121*  --   --  192*  --  128* 140*  --   --  185*  --  76  BUN 56*  --  59*  --   --  54*  --  55* 54*  --   --  52*  --  9  CREATININE 2.22*  --  2.46*  --   --  2.20*  --  2.01* 2.05*  --   --  1.79*  --  0.81  CALCIUM 8.0*  --  7.8*  --   --  7.3*  --  7.5* 7.2*  --   --  7.8*  --  8.2*  MG 1.6  --   --   --   --   --   --   --   --   --   --   --   --   --   PHOS 3.6  --   --   --   --   --   --   --   --   --    --   --   --   --   AST  --   --  26  --   --  38*  --   --   --   --   --  36  --   --   ALT  --   --  28  --   --  29  --   --   --   --   --  38*  --   --   ALKPHOS  --   --  204*  --   --  198*  --   --   --   --   --  352*  --   --   BILITOT  --   --  1.7*  --   --  2.4*  --   --   --   --   --  3.7*  --   --   PROT  --   --  5.0*  --   --  4.9*  --   --   --   --   --  5.4*  --   --   ALBUMIN  --   --  1.9*  --   --  1.8*  --   --   --   --   --  1.8*  --   --   APTT  --   --   --   --   --   --   --   --   --   --   --  29  --   --   INR  --   --   --   --   --   --   --   --   --   --   --  1.24  --   --   LATICACIDVEN 1.9  --   --   --   --   --   --  2.4* 1.6  --   --   --   --   --   TROPONINI  --   --   --  <0.30  --   --   --  <0.30  --   --   --   --   --   --   PROCALCITON  --   --   --   --  93.65  --   --   --   --   --   --   --   --   --   PHART  --   --   --   --   --   --   < >  --   --  7.187* 7.526*  --  7.368  --   PCO2ART  --   --   --   --   --   --   < >  --   --  33.6* 25.9*  --  36.8  --   PO2ART  --   --   --   --   --   --   < >  --   --  256.0* 207.0*  --  86.0  --   < > = values in this interval not displayed. No results found for this basename: GLUCAP,  in the last 168 hours  CXR:  11/18 >>> hardware in place, pulmonary edema, small L effusion  ASSESSMENT:  Initially acute respiratory failure secondary to severe non-gap acidosis, requiring BiPAP, now resolved with bicarbonate infusion. Acute pulmonary edema, unable to diurese earlier  due to hypotension. Acute hypoxemic respiratory failure. Possible HCAP / aspiration pneumonia.  PLAN:  -->  Patient clearly states " I don't want the tube". This was discussed in detail with daughter and son-in-law Occupational psychologist here at [facility]).  They agree that patient's wishes should be respected.  Son-in-law will continue to discuss with patient to reconcile her DNI wishes with "Full code status". Upon decision eMD will be  notified.  I also made myself available for further discussions. -->  Lasix 40 IV x 1 now.  Goal net negative 100-150 mL/hour as BP permits.  RN to notify eMD if goals are not met, may need to repeat. -->  CVP q4h, goal 3-4. -->  Antibiotics reviewed, WBC decreasing, consider repeat PCT. -->  BiPAP PRN. -->  Would avoid sedatives as agitation is likely related to work of breathing.  I have personally obtained a history, examined the patient, evaluated laboratory and imaging results, formulated the assessment and plan and placed orders.  CRITICAL CARE:  The patient is critically ill with multiple organ systems failure and requires high complexity decision making for assessment and support, frequent evaluation and titration of therapies, application of advanced monitoring technologies and extensive interpretation of multiple databases. Critical Care Time devoted to patient care services described in this note is 45 minutes.   Lonia Farber, MD Pulmonary and Critical Care Medicine [facility] Pager: [phone]  04/20/2013, 9:02 PM

## 2013-04-20 NOTE — Progress Notes (Signed)
RN calling with resp distress  Plan contnious bipap Dr Earney Mallet bedside MD to evaluate   Dr. Kalman Shan, M.D., [facility].C.P Pulmonary and Critical Care Medicine Staff Physician [facility] [place] Pulmonary and Critical Care Pager: [phone], If no answer or between  15:00h - 7:00h: call [phone]  04/20/2013 7:10 PM

## 2013-04-20 NOTE — Consult Note (Signed)
PULMONARY  / CRITICAL CARE MEDICINE  Name: Breanna Guerrero MRN: [id number] DOB: 1928/04/30    ADMISSION DATE:  04/17/2013 CONSULTATION DATE:  04/18/13  REFERRING MD :  Danise Edge  PRIMARY SERVICE: IMTS   CHIEF COMPLAINT:  Hypotension   BRIEF PATIENT DESCRIPTION:  46 F with recent dx of rectal cancer s/p resection w/ ileostomy 01/2013 at [facility] . Chemo/XRT completed by Dr Truett Perna 10/2012. Transferred from outside hospital for hypovolemia and acute renal failure . B/P remained low despite fluid challenge. Moved to ICU and PCCM assumed care 11/16  SIGNIFICANT EVENTS / STUDIES:  11/16 Transfer to ICU for persistent hypotension 11/16 Suspected transfusion reaction with dyspnea requiring NPPV 11/16 CT abd:  post surgical changes, incompletely healed fracture of R sacrum and pubic symphysis  11/17 AFRVR > amiodarone 11/17 abdo US>>> 6 mm gallstone noted within the fundus of the gallbladder.<BR>Gallbladder wall is slightly thickened at 3.3 mm without, pericholecystic fluid or sonographic Murphy's sign 11/17 asymmetric LE edema > LE venous Dopplers:  11/17 Abn LFTs > RUQ [place]:  Prelim negative 11/17: Bipap required, distress  LINES / TUBES:  CULTURES: 11/15 C Diff>> NEG 11/16 BC x2 >>  11/16 Urine >>   ANTIBIOTICS: 11/16 Zosyn >> 11/18 vanc>>> 11/18 diflucan ( empiric esophagitis)>>>  SUBJECTIVE:   Distress remains, odynophagia  VITAL SIGNS: Temp:  [97.3 F (36.3 C)-100.9 F (38.3 C)] 98.4 F (36.9 C) (11/18 0505) Pulse Rate:  [56-139] 86 (11/18 0800) Resp:  [19-51] 28 (11/18 0800) BP: (80-154)/(44-96) 115/55 mmHg (11/18 0800) SpO2:  [72 %-100 %] 98 % (11/18 0800) FiO2 (%):  [3 %-50 %] 50 % (11/18 0400) HEMODYNAMICS:   VENTILATOR SETTINGS: Vent Mode:  [-] BIPAP FiO2 (%):  [3 %-50 %] 50 % PEEP:  [5 cmH20] 5 cmH20 INTAKE / OUTPUT: Intake/Output     11/17 0701 - 11/18 0700 11/18 0701 - 11/19 0700   P.O.     I.V. (mL/kg) 1871.5 (33.7) 65 (1.2)   Blood     IV Piggyback 250    Total  Intake(mL/kg) 2121.5 (38.2) 65 (1.2)   Urine (mL/kg/hr) 1575 (1.2) 40 (0.6)   Stool 600 (0.5)    Total Output 2175 40   Net -53.5 +25          PHYSICAL EXAMINATION: General:  Elderly female, alert, tachypneic remains Neuro:  Alert, following commands, moves extremities x 4  HEENT:  MM pink and moist  Cardiovascular:  s1 s2 RRR m/r/g  Lungs:  Reduced ronchi rt  Abdomen:  Soft,  Hyperactive BS +, ostomy site is viable, no induration.  Mild generalized tenderness Musculoskeletal:  BLE edema R>L Skin:  Pale   LABS: I have reviewed all of today's lab results. Relevant abnormalities are discussed in the A/P section  CXR: int edema? infiltrate Rt greater left , roation  ASSESSMENT / PLAN:  PULMONARY A:  Tachypnea remains, regardless of correction of ph Correction NON AG  With bicarb concern Asp PNA P:   Cont supplemental O2 Cont scheduled NPPV x 24 hrs HCO3 infusion -make sure dc given ph now pcxr in am  May need cvp  CARDIOVASCULAR A:  Hypotension - suspect hypovolemic New onset AF with RVR (11/17) - sepsis related likely RLE swelling -neg dvt P:  Trop negative Begin amiodarone 11/17, maintain Consider line and cvp BLE dopplers r/o DVT Echo assessment  RENAL A:   AKI, nonoliguric Mixed met acidosis P:   May need further correction NON AG in future, repeat abg Monitor BMET intermittently bmet  in am and pm Consider cvp Monitor ostomy ouput, bicarb may need restart  GASTROINTESTINAL A: S/P resection of rectal Ca with ileostomy 8/14 High ostomy output Prot-cal malnutrition w/ low albumin and anorexia  R/o esophagitis, candidia P:   SUP: IV PPI (indication - thrombocytopenia) to bid Advance diet as tolerated RUQ ultrasound 11/17 neg abg Add empiric diflucan  HEMATOLOGIC A:   Anemia without overt bleeding Thrombocytopenia (sepsis) P:  Monitor CBC intermittently Hgb goal >7 SCDs prophylaxis Treat sepsis  INFECTIOUS A:  Severe sepsis - unknown  source  Markedly elevated PCT Leukocytosis with left shift P:   Micro and abx as above Add vanc Add empiric diflucan PCt impressive, no role further as will follow clinical status, and wil treat sepsis no matter what pct  ENDOCRINE A:   Mild hyperglycemia P:   Monitor glu on chem profiles Begin SSI for glu > 180  NEUROLOGIC A:  Encephalopathy, resolved Physical deconditioning P:   Monitor abg  TODAY'S SUMMARY:  ASP PNA? Output from ostomy driving distress not only factor. May need line, dc bicarb, schedule NIMV  Updated daughter and pt in full extensive Ccm time 35 min  Mcarthur Rossetti. Tyson Alias, MD, FACP Pgr: [phone] [place] [facility]

## 2013-04-20 NOTE — Progress Notes (Signed)
Pt. Has episode of low BP MAP <60 and SBP was in the 80's taken both in right and left arm. Dr. Darrick Penna made aware and also noted pt. converted back to NSR/ST but still in and out of afib. NS 250 IV bolus was given with good response.  SBP >90's and MAP >60's.

## 2013-04-20 NOTE — Progress Notes (Signed)
eLink Physician-Brief Progress Note Patient Name: Breanna Guerrero DOB: 30-May-1928 MRN: [id number]  Date of Service  04/20/2013   HPI/Events of Note  Patient had been placed on BiPAP and Bicarb gtt for metabolic acidosis.  ABG now 7.53/26/207/21.  Has had some ongoing issues with intermittent AF/RVR now on amio gtt.   eICU Interventions  Plan: D/C bicarb gtt NS at [facility] Monitor BP Take off BiPAP for now   Intervention Category Major Interventions: Acid-Base disturbance - evaluation and management  [name] 04/20/2013, 3:52 AM

## 2013-04-20 NOTE — Progress Notes (Signed)
Patient is tolerating [place] well at this time. Patient does not want bipap at this time. RT will continue to monitor.

## 2013-04-20 NOTE — Progress Notes (Signed)
1. Dr Herma Carson evaluated patient at bedside and feels patient needs diuersis and not bipap. So she is off bipap 2. RN wanting diet order clarification: per [facility] notes: ltd clears only. So given that order   Dr. Kalman Shan, M.D., [facility].C.P Pulmonary and Critical Care Medicine Staff Physician [facility] [place] Pulmonary and Critical Care Pager: [phone], If no answer or between  15:00h - 7:00h: call [phone]  04/20/2013 7:31 PM

## 2013-04-20 NOTE — Progress Notes (Signed)
Pt reports that she is "seeing things" daughter reports that while at [facility] she was placed on amiodarone and had these same symptoms. Spoke with Dr. Marchelle Gearing and for now we will discontinue.

## 2013-04-20 NOTE — Progress Notes (Signed)
Back in A RIB with HR 99. Will monitor. If needed do cardizem  Patient wants to go back on bipap for subjective reasons: ok per Me  Dr. Kalman Shan, M.D., [facility].C.P Pulmonary and Critical Care Medicine Staff Physician [facility] [place] Pulmonary and Critical Care Pager: [phone], If no answer or between  15:00h - 7:00h: call [phone]  04/20/2013 10:15 PM

## 2013-04-20 NOTE — Progress Notes (Signed)
Echocardiogram 2D Echocardiogram has been performed.  Breanna Guerrero 04/20/2013, 12:14 PM

## 2013-04-20 NOTE — Progress Notes (Signed)
Pts. HR up to the 130's-150's appears to be irregular in rythm and shows converted back to afib. Dr. Darrick Penna made aware with orders to start the on hold amiodarone drip. Amio drip started will cont to monitor pt.

## 2013-04-20 NOTE — Progress Notes (Signed)
RT placed patient on NIV PSV 12/6, 40%. Patient is tolerating bipap well at this time. RT will continue to monitor.

## 2013-04-20 NOTE — Progress Notes (Signed)
Visual hallucinations per RN as reported to her by patient  Daughter ssays same happened with amiodarone at [facility]  Curently NSR with HR 80 and MAP 69  Plan Dc amio  Dr. Kalman Shan, M.D., [facility].C.P Pulmonary and Critical Care Medicine Staff Physician [facility] Pulmonary and Critical Care Pager: [phone], If no answer or between  15:00h - 7:00h: call [phone]  04/20/2013 4:44 PM

## 2013-04-20 NOTE — Progress Notes (Signed)
PT Cancellation Note  Patient Details Name: ANGELA VAZGUEZ MRN: [id number] DOB: [birth year]   Cancelled Treatment:    Reason Eval/Treat Not Completed: Patient not medically ready. RN deferred this date due to poor respiratory status. Pt on/off BiPap. Acute PT to return as able.   Marcene Brawn 04/20/2013, 10:51 AM

## 2013-04-20 NOTE — Progress Notes (Signed)
Pt. Son in law at the bedside talk about pts. wishes when it comes to code status. Pt. is very alert and talk to his son in law about what she wanted. Pt. desire not to be intubated and put in the machine. The son in law stated that pt. did not mention anything else except for no intubation and mechanical ventilation and will do still everything as clarified by RN. Dr. Marchelle Gearing made aware of pts. wish as verbalized by son in law to RN

## 2013-04-20 NOTE — Procedures (Signed)
Central Venous Catheter Insertion Procedure Note IVANKA KIRSHNER [id number] [birth year]  Procedure: Insertion of Central Venous Catheter Indications: Assessment of intravascular volume  Procedure Details Consent: Risks of procedure as well as the alternatives and risks of each were explained to the (patient/caregiver).  Consent for procedure obtained. Time Out: Verified patient identification, verified procedure, site/side was marked, verified correct patient position, special equipment/implants available, medications/allergies/relevent history reviewed, required imaging and test results available.  Performed Real time [place] used to ID and cannulate the vessel  Maximum sterile technique was used including antiseptics, cap, gloves, gown, hand hygiene, mask and sheet. Skin prep: Chlorhexidine; local anesthetic administered A antimicrobial bonded/coated triple lumen catheter was placed in the right internal jugular vein using the Seldinger technique.  Evaluation Blood flow good Complications: No apparent complications Patient did tolerate procedure well. Chest X-ray ordered to verify placement.  CXR: pending.  BABCOCK,PETE 04/20/2013, 2:40 PM  Supervised in full  [place] gudiance  Mcarthur Rossetti. Tyson Alias, MD, FACP Pgr: [phone] [place] [facility]

## 2013-04-20 NOTE — Progress Notes (Signed)
Pts. Blood gas resulted and Dr. Darrick Penna made aware, with orders made. Pt. Off bipap and IFV changed to NS at [facility]. Breathing better still with some tachypnea especially when awake and some agitation. Refused blood work this am.

## 2013-04-20 NOTE — Progress Notes (Signed)
RN calling. Says son in law Dr Renato Gails just told RN and RN has confirmed with the patient who appears to have capacity that she is DNI but still wants CPR and other measures  Plan Code status changed to reflect DNI   Dr. Kalman Shan, M.D., [facility].C.P Pulmonary and Critical Care Medicine Staff Physician [facility] Pager: [phone], If no answer or between  15:00h - 7:00h: call [phone]  04/20/2013 8:28 PM

## 2013-04-20 NOTE — Progress Notes (Addendum)
Dr. Marin Shutter with [facility] in and assessed pt. with orders made. Given 2 more doses of bicarb slow IV push total of . Will do blood gas at 0300 RT is aware. Dr. Zorita Pang was able to update the daughter about patients condition. Will cont. to monitor patient.

## 2013-04-20 NOTE — Consult Note (Signed)
PHARMACY CONSULT NOTE - INITIAL  Pharmacy Consult for :   Vancomycin Indication:  Asp PNA, Severe Sepsis  Hospital Problems: Principal Problem:   Severe sepsis(995.92) Active Problems:   Volume depletion   Acute kidney injury   Tremulousness   Dysphagia   Anemia, chronic disease   Malnutrition of moderate degree   Allergies: Allergies  Allergen Reactions  . Sulfa Antibiotics   . Latex Rash    Patient Measurements: Height: 5\' 2"  (157.5 cm) Weight: 122 lb 5.7 oz (55.5 kg) IBW/kg (Calculated) : 50.1  Vital Signs: BP 110/58  Pulse 88  Temp(Src) 98.4 F (36.9 C) (Core (Comment))  Resp 41  Ht 5\' 2"  (1.575 m)  Wt 122 lb 5.7 oz (55.5 kg)  BMI 22.37 kg/m2  SpO2 96%  Labs:  Recent Labs  04/18/13 1410 04/19/13 0100 04/19/13 0357  WBC 20.8* 19.7* 17.8*  HGB 7.0* 8.0* 7.8*  PLT 76* 74* 57*  CREATININE 2.20* 2.01* 2.05*   Estimated Creatinine Clearance: 16.4 ml/min (by C-G formula based on Cr of 2.05).   Microbiology: Recent Results (from the past 720 hour(s))  CLOSTRIDIUM DIFFICILE BY PCR     Status: None   Collection Time    04/17/13  9:49 PM      Result Value Range Status   C difficile by pcr NEGATIVE  NEGATIVE Final  CULTURE, BLOOD (ROUTINE X 2)     Status: None   Collection Time    04/18/13  9:25 AM      Result Value Range Status   Specimen Description BLOOD RIGHT ARM   Final   Special Requests BOTTLES DRAWN AEROBIC ONLY 10CC   Final   Culture  Setup Time     Final   Value: 04/18/2013 17:55     Performed at [facility]   Culture     Final   Value:        BLOOD CULTURE RECEIVED NO GROWTH TO DATE CULTURE WILL BE HELD FOR 5 DAYS BEFORE ISSUING A FINAL NEGATIVE REPORT     Performed at [facility]   Report Status PENDING   Incomplete  CULTURE, BLOOD (ROUTINE X 2)     Status: None   Collection Time    04/18/13 11:07 AM      Result Value Range Status   Specimen Description BLOOD RIGHT ARM   Final   Special Requests BOTTLES DRAWN  AEROBIC ONLY 10CC   Final   Culture  Setup Time     Final   Value: 04/18/2013 17:58     Performed at [facility]   Culture     Final   Value:        BLOOD CULTURE RECEIVED NO GROWTH TO DATE CULTURE WILL BE HELD FOR 5 DAYS BEFORE ISSUING A FINAL NEGATIVE REPORT     Performed at [facility]   Report Status PENDING   Incomplete  URINE CULTURE     Status: None   Collection Time    04/18/13 11:12 PM      Result Value Range Status   Specimen Description URINE, RANDOM   Final   Special Requests NONE   Final   Culture  Setup Time     Final   Value: 04/19/2013 09:44     Performed at [facility]   Culture     Final   Value: NO GROWTH     Performed at [facility]   Report Status 04/20/2013 FINAL   Final  URINE CULTURE     Status: None   Collection Time    04/19/13  2:06 AM      Result Value Range Status   Specimen Description URINE, RANDOM   Final   Special Requests PATIENT ON FOLLOWING ZOSYN   Final   Culture  Setup Time     Final   Value: 04/19/2013 09:44     Performed at [facility]   Culture     Final   Value: NO GROWTH     Performed at [facility]   Report Status 04/20/2013 FINAL   Final  MRSA PCR SCREENING     Status: None   Collection Time    04/19/13  2:40 AM      Result Value Range Status   MRSA by PCR NEGATIVE  NEGATIVE Final   Comment:            The GeneXpert MRSA Assay (FDA     approved for NASAL specimens     only), is one component of a     comprehensive MRSA colonization     surveillance program. It is not     intended to diagnose MRSA     infection nor to guide or     monitor treatment for     MRSA infections.  WET PREP, GENITAL     Status: Abnormal   Collection Time    04/19/13  1:28 PM      Result Value Range Status   Yeast Wet Prep HPF POC NONE SEEN  NONE SEEN Final   Trich, Wet Prep NONE SEEN  NONE SEEN Final   Clue Cells Wet Prep HPF POC NONE SEEN  NONE SEEN Final   WBC, Wet Prep HPF POC MANY (*)  NONE SEEN Final    Medical/Surgical History: Past Medical History  Diagnosis Date  . Hypertension   . Stroke May 2013    mild stroke  . Hemorrhoids   . Cancer     Rectal adenocarcinoma   Past Surgical History  Procedure Laterality Date  . Hemorrhoid surgery    . Colonoscopy    . Eus N/A 09/09/2012    Procedure: LOWER ENDOSCOPIC ULTRASOUND (EUS);  Surgeon: Willis Modena, MD;  Location: [facility];  Service: Endoscopy;  Laterality: N/A;  moderate sedation  . Rectal biopsy  09/03/2012    Suspicious Adenocarcinoma    Medications:  Scheduled:  . feeding supplement (ENSURE COMPLETE)  237 mL Oral BID BM  . fluconazole (DIFLUCAN) IV  200 mg Intravenous Q24H  . pantoprazole (PROTONIX) IV  40 mg Intravenous Q12H  . piperacillin-tazobactam (ZOSYN)  IV  2.25 g Intravenous Q8H  . sodium chloride  3 mL Intravenous Q12H  . vancomycin (VANCOCIN) 750 mg IVPB  750 mg Intravenous Q48H   Infusions:  . sodium chloride 20 mL/hr at 04/20/13 1000  . amiodarone (NEXTERONE PREMIX) 360 mg/200 mL dextrose 30 mg/hr (04/20/13 1000)   Anti-infectives   Start     Dose/Rate Route Frequency Ordered Stop   04/20/13 1045  vancomycin (VANCOCIN) 750 mg in sodium chloride 0.9 % 150 mL IVPB     750 mg 150 mL/hr over 60 Minutes Intravenous Every 48 hours 04/20/13 1034     04/20/13 1000  fluconazole (DIFLUCAN) IVPB 200 mg     200 mg 100 mL/hr over 60 Minutes Intravenous Every 24 hours 04/20/13 0854 04/27/13 0959   04/18/13 1000  piperacillin-tazobactam (ZOSYN) IVPB 2.25 g  Status:  Discontinued  2.25 g 100 mL/hr over 30 Minutes Intravenous Every 8 hours 04/18/13 0941 04/18/13 0947   04/18/13 1000  piperacillin-tazobactam (ZOSYN) IVPB 2.25 g     2.25 g 100 mL/hr over 30 Minutes Intravenous Every 8 hours 04/18/13 0947        Assessment:  53 F with recent dx of rectal cancer s/p resection w/ ileostomy admitted with hypotension, AKI, and resp distress who has been placed on Vancomycin and Zosyn for  suspected Asp PNA.  Renal function currently with est CrCl 16 ml/min.  WBC 17.8.  Patient afebrile.  Pharmacy asked to manage Vancomycin.   Goal of Therapy:   Vancomycin trough level 15-20 mcg/ml Antibiotics selected for infection/cultures and adjusted for renal function.   Plan:   Continue Zosyn 2.25 gm IV q 8 hours as adjusted for renal function.  Begin Vancomycin 750 mg IV q 48 hours. Follow up SCr, UOP, renal output, cultures, clinical course and adjust as clinically indicated.  Latavia Goga, Elisha Headland,  Pharm.D.,    11/18/201410:35 AM

## 2013-04-21 ENCOUNTER — Encounter (HOSPITAL_COMMUNITY): Payer: Self-pay | Admitting: Gastroenterology

## 2013-04-21 ENCOUNTER — Inpatient Hospital Stay (HOSPITAL_COMMUNITY): Payer: Medicare Other

## 2013-04-21 DIAGNOSIS — I4891 Unspecified atrial fibrillation: Secondary | ICD-10-CM

## 2013-04-21 HISTORY — DX: Unspecified atrial fibrillation: I48.91

## 2013-04-21 LAB — BASIC METABOLIC PANEL
BUN: 43 mg/dL — ABNORMAL HIGH (ref 6–23)
Calcium: 7.8 mg/dL — ABNORMAL LOW (ref 8.4–10.5)
Chloride: 101 mEq/L (ref 96–112)
Creatinine, Ser: 1.5 mg/dL — ABNORMAL HIGH (ref 0.50–1.10)
GFR calc Af Amer: 36 mL/min — ABNORMAL LOW (ref >90)
GFR calc non Af Amer: 31 mL/min — ABNORMAL LOW (ref >90)
Sodium: 139 mEq/L (ref 135–145)

## 2013-04-21 LAB — CBC WITH DIFFERENTIAL/PLATELET
Basophils Absolute: 0 10*3/uL (ref 0.0–0.1)
Basophils Relative: 0 % (ref 0–1)
Lymphocytes Relative: 4 % — ABNORMAL LOW (ref 12–46)
MCHC: 36.8 g/dL — ABNORMAL HIGH (ref 30.0–36.0)
Monocytes Absolute: 0.4 10*3/uL (ref 0.1–1.0)
Neutro Abs: 13.4 10*3/uL — ABNORMAL HIGH (ref 1.7–7.7)
Neutrophils Relative %: 93 % — ABNORMAL HIGH (ref 43–77)
Platelets: 37 10*3/uL — ABNORMAL LOW (ref 150–400)
RDW: 16.5 % — ABNORMAL HIGH (ref 11.5–15.5)
WBC: 14.5 10*3/uL — ABNORMAL HIGH (ref 4.0–10.5)

## 2013-04-21 LAB — LEGIONELLA ANTIGEN, URINE

## 2013-04-21 MED ORDER — FUROSEMIDE 10 MG/ML IJ SOLN
40.0000 mg | Freq: Four times a day (QID) | INTRAMUSCULAR | Status: DC
  Administered 2013-04-21 (×2): 40 mg via INTRAVENOUS
  Filled 2013-04-21 (×2): qty 4

## 2013-04-21 MED ORDER — DRONEDARONE HCL 400 MG PO TABS
400.0000 mg | ORAL_TABLET | Freq: Two times a day (BID) | ORAL | Status: DC
  Administered 2013-04-21 – 2013-04-30 (×15): 400 mg via ORAL
  Filled 2013-04-21 (×22): qty 1

## 2013-04-21 MED ORDER — DIGOXIN 125 MCG PO TABS
0.1250 mg | ORAL_TABLET | Freq: Every day | ORAL | Status: DC
  Administered 2013-04-21 – 2013-04-24 (×4): 0.125 mg via ORAL
  Filled 2013-04-21 (×4): qty 1

## 2013-04-21 MED ORDER — PIPERACILLIN-TAZOBACTAM 3.375 G IVPB
3.3750 g | Freq: Three times a day (TID) | INTRAVENOUS | Status: AC
  Administered 2013-04-22 – 2013-04-27 (×19): 3.375 g via INTRAVENOUS
  Filled 2013-04-21 (×20): qty 50

## 2013-04-21 MED ORDER — RESOURCE THICKENUP CLEAR PO POWD
ORAL | Status: DC | PRN
  Filled 2013-04-21 (×2): qty 125

## 2013-04-21 MED ORDER — POTASSIUM CHLORIDE 20 MEQ/15ML (10%) PO LIQD
40.0000 meq | Freq: Once | ORAL | Status: AC
  Administered 2013-04-21: 40 meq
  Filled 2013-04-21: qty 30

## 2013-04-21 MED ORDER — POTASSIUM CHLORIDE 10 MEQ/50ML IV SOLN
10.0000 meq | INTRAVENOUS | Status: AC
  Administered 2013-04-21 – 2013-04-22 (×5): 10 meq via INTRAVENOUS
  Filled 2013-04-21: qty 50

## 2013-04-21 MED ORDER — BIOTENE DRY MOUTH MT LIQD
15.0000 mL | Freq: Two times a day (BID) | OROMUCOSAL | Status: DC
  Administered 2013-04-21 – 2013-04-30 (×18): 15 mL via OROMUCOSAL

## 2013-04-21 MED ORDER — POTASSIUM CHLORIDE 10 MEQ/50ML IV SOLN
10.0000 meq | INTRAVENOUS | Status: DC
  Administered 2013-04-21: 10 meq via INTRAVENOUS
  Filled 2013-04-21: qty 50

## 2013-04-21 NOTE — Consult Note (Signed)
CARDIOLOGY CONSULT NOTE       Patient ID: Breanna Guerrero MRN: [id number] DOB/AGE: [birth year] 77 y.o.  Admit date: 04/17/2013 Referring Physician:  Tyson Alias Primary Physician: Breanna Mitts, MD Primary Cardiologist:  New Reason for Consultation:  Afib  Principal Problem:   Severe sepsis(995.92) Active Problems:   Volume depletion   Acute kidney injury   Tremulousness   Dysphagia   Anemia, chronic disease   Malnutrition of moderate degree   HCAP (healthcare-associated pneumonia)   Acute pulmonary edema   Acute respiratory failure   HPI:   79 yo who has been sick since August. Has  rectal cancer s/p neoadjuvant chemotherapy, radiation, and low anterior resection and diverting loop ileostomy on 01/07/13.  She has been in and out Of hospitals including [facility] and [facility] hospital.  Seems to have sepsis/SIRS with possible recent aspiration.  Has had recurrent PAF.  Daughter Breanna Guerrero) notes that she had hallucinations on iv amidodarone At [facility] and they recurred here when CCM tried it.  No history of cardiac illness.  Currently in afib/flutter rates 88-105.  No chest pain, mild dyspnea, no palpitations  Has had dehydration, hypotension. I read her  Echo from yesterday and EF 55%, with mild MR mild biatrial enlargement, dilated IVC but PA pressure estimate not too high.  At some point will still need ileostomy taken down.     ROS All other systems reviewed and negative except as noted above  Past Medical History  Diagnosis Date  . Hypertension   . Stroke May 2013    mild stroke  . Hemorrhoids   . Cancer     Rectal adenocarcinoma    Family History  Problem Relation Age of Onset  . Breast cancer Sister   . Brain cancer Sister   . Leukemia Maternal Aunt     History   Social History  . Marital Status: Married    Spouse Name: N/A    Number of Children: 2  . Years of Education: N/A   Occupational History  .     Social History Main Topics  . Smoking status: Never  Smoker   . Smokeless tobacco: Not on file  . Alcohol Use: No  . Drug Use: No  . Sexual Activity: Not on file   Other Topics Concern  . Not on file   Social History Narrative   Married   1 daughter here in [place] and 1 son in [place]   Resident of [place] but staying with daughter for treatment   Parents had no history of cancer    Past Surgical History  Procedure Laterality Date  . Hemorrhoid surgery    . Colonoscopy    . Eus N/A 09/09/2012    Procedure: LOWER ENDOSCOPIC ULTRASOUND (EUS);  Surgeon: Willis Modena, MD;  Location: [facility];  Service: Endoscopy;  Laterality: N/A;  moderate sedation  . Rectal biopsy  09/03/2012    Suspicious Adenocarcinoma     . antiseptic oral rinse  15 mL Mouth Rinse BID  . feeding supplement (ENSURE COMPLETE)  237 mL Oral BID BM  . fluconazole (DIFLUCAN) IV  200 mg Intravenous Q24H  . furosemide  40 mg Intravenous Q6H  . pantoprazole (PROTONIX) IV  40 mg Intravenous Q12H  . piperacillin-tazobactam (ZOSYN)  IV  2.25 g Intravenous Q8H  . sodium chloride  3 mL Intravenous Q12H  . vancomycin (VANCOCIN) 750 mg IVPB  750 mg Intravenous Q48H   . sodium chloride 20 mL/hr at 04/21/13 0700  Physical Exam: Blood pressure 108/68, pulse 136, temperature 99.1 F (37.3 C), temperature source Core (Comment), resp. rate 33, height 5\' 2"  (1.575 m), weight 122 lb 5.7 oz (55.5 kg), SpO2 97.00%.   Affect appropriate Chronically ill elderly female  HEENT: normal Neck supple with no adenopathy JVP normal no bruits no thyromegaly Lungs clear with no wheezing and good diaphragmatic motion Heart:  S1/S2 no murmur, no rub, gallop or click PMI normal Abdomen: benighn, BS positve ilostomy no bruit.  No HSM or HJR Distal pulses intact with no bruits No edema Neuro non-focal Skin warm and dry No muscular weakness   Labs:   Lab Results  Component Value Date   WBC 14.5* 04/21/2013   HGB 7.7* 04/21/2013   HCT 20.9* 04/21/2013   MCV 76.0*  04/21/2013   PLT 37* 04/21/2013    Recent Labs Lab 04/20/13 0956 04/20/13 1820  NA 139 144  K 3.3* 3.8  CL 103 111  CO2 19 23  BUN 52* 9  CREATININE 1.79* 0.81  CALCIUM 7.8* 8.2*  PROT 5.4*  --   BILITOT 3.7*  --   ALKPHOS 352*  --   ALT 38*  --   AST 36  --   GLUCOSE 185* 76    Scheduled Meds: . antiseptic oral rinse  15 mL Mouth Rinse BID  . feeding supplement (ENSURE COMPLETE)  237 mL Oral BID BM  . fluconazole (DIFLUCAN) IV  200 mg Intravenous Q24H  . furosemide  40 mg Intravenous Q6H  . pantoprazole (PROTONIX) IV  40 mg Intravenous Q12H  . piperacillin-tazobactam (ZOSYN)  IV  2.25 g Intravenous Q8H  . sodium chloride  3 mL Intravenous Q12H  . vancomycin (VANCOCIN) 750 mg IVPB  750 mg Intravenous Q48H   Continuous Infusions: . sodium chloride 20 mL/hr at 04/21/13 0700   PRN Meds:.ondansetron (ZOFRAN) IV, RESOURCE THICKENUP CLEAR   Radiology: Ct Abdomen Pelvis Wo Contrast  04/18/2013   CLINICAL DATA:  Sepsis with rectal mucinous adenocarcinoma resected in August 2014.  EXAM: CT ABDOMEN AND PELVIS WITHOUT CONTRAST  TECHNIQUE: Multidetector CT imaging of the abdomen and pelvis was performed following the standard protocol without intravenous contrast.  COMPARISON:  CT scan dated 09/04/2012  FINDINGS: There are tiny bilateral pleural effusions with bibasilar atelectasis and marked peribronchial thickening in the lower lobes.  The unenhanced liver, spleen, pancreas, adrenal glands, and kidneys are normal except for a tiny stone in the upper pole of the right kidney. Ileostomy is noted in the right mid abdomen. There are no dilated loops of bowel. Uterus is normal. Right ovary is normal. Left ovary is not well-defined.  Postsurgical changes in the presacral space. No appreciable abdominal abscesses. Slight anasarca in the subcutaneous fat of the abdomen and pelvis.  The patient has fractures of the right side of the sacrum and of the right side of the symphysis pubis which are  new since 09/04/2012 but these are not acute. There are incompletely healed.  IMPRESSION: 1. Incompletely healed fractures of the right side of the symphysis pubis and right sacral ala. 2. Postsurgical changes in lower pelvis. No acute abnormality in the abdomen. 3. Small bilateral pleural effusions with bibasilar atelectasis and peribronchial thickening. 4. Slight anasarca.   Electronically Signed   By: Geanie Cooley M.D.   On: 04/18/2013 17:04   US Abdomen Complete  04/19/2013   CLINICAL DATA:  Question cholecystitis.  EXAM: ULTRASOUND ABDOMEN COMPLETE  COMPARISON:  CT 04/18/2013  FINDINGS: Gallbladder  6 mm gallstone  noted within the fundus of the gallbladder. No wall thickening scratch stab borderline wall thickness at 3.3 mm. No sonographic Murphy's sign. No pericholecystic fluid.  Common bile duct  Diameter: Normal caliber, 4 mm.  Liver  No focal lesion identified. Within normal limits in parenchymal echogenicity.  IVC  No abnormality visualized.  Pancreas  Visualized portion unremarkable.  Spleen  Size and appearance within normal limits.  Right Kidney  Length: 9.6 cm. Echogenicity within normal limits. No mass or hydronephrosis visualized.  Left Kidney  Length: 12.1 cm. Echogenicity within normal limits. No mass or hydronephrosis visualized.  Abdominal aorta  No aneurysm visualized.  IMPRESSION: 6 mm gallstone noted within the fundus of the gallbladder. Gallbladder wall is slightly thickened at 3.3 mm without pericholecystic fluid or sonographic Murphy's sign. This could reflect changes of chronic cholecystitis. If clinical concern for acute cholecystitis persists, nuclear medicine hepatobiliary scan may be helpful.   Electronically Signed   By: Charlett Nose M.D.   On: 04/19/2013 16:18   Dg Chest Port 1 View  04/21/2013   CLINICAL DATA:  Pneumonia.  EXAM: PORTABLE CHEST - 1 VIEW  COMPARISON:  04/20/2013  FINDINGS: Right jugular central venous catheter is present with tip overlying the lower SVC,  unchanged. Cardiomediastinal silhouette is within normal limits. Pulmonary edema has slightly improved from the prior study. There is persistent retrocardiac opacity in the left lung base, which may reflect atelectasis versus infectious infiltrate. Trace bilateral pleural effusions are questioned. No acute osseous abnormality.  IMPRESSION: 1. Slightly improved pulmonary edema. 2. Persistent left basilar opacity, atelectasis versus infection. 3. Query trace bilateral pleural effusions.   Electronically Signed   By: Sebastian Ache   On: 04/21/2013 14:09      EKG:  NSR normal one ECG from earlier in admission with afib and no acute ischemic changes   ASSESSMENT AND PLAN:  Afib:  Oral absorption of amiodarone in setting recent GI surgery probably not ideal Has had side effects repeatedly with iv amiodarone. EF normal by echo Favor Multaq 400 bid For suppression of arrhythmia.  Would use DVT prophylaxis but not full anticoagulation given comorbidities, age, anemia and recent bowel surgery with persistent ilieostomy  Add digoxin for rate control  SIRS: ? From aspiration  dont think she will need q6 diuretics with normal EF and kidneys.  Antibiotics per CCM.  Gallbladder thickened but appears ok  Signed: Charlton Haws 04/21/2013, 3:38 PM

## 2013-04-21 NOTE — Evaluation (Signed)
Clinical/Bedside Swallow Evaluation Patient Details  Name: Breanna Guerrero MRN: [id number] Date of Birth: 03-24-1928  Today's Date: 04/21/2013 Time: 0840-0900 SLP Time Calculation (min): 20 min  Past Medical History:  Past Medical History  Diagnosis Date  . Hypertension   . Stroke May 2013    mild stroke  . Hemorrhoids   . Cancer     Rectal adenocarcinoma   Past Surgical History:  Past Surgical History  Procedure Laterality Date  . Hemorrhoid surgery    . Colonoscopy    . Eus N/A 09/09/2012    Procedure: LOWER ENDOSCOPIC ULTRASOUND (EUS);  Surgeon: Willis Modena, MD;  Location: [facility];  Service: Endoscopy;  Laterality: N/A;  moderate sedation  . Rectal biopsy  09/03/2012    Suspicious Adenocarcinoma   HPI:  Pt found her down on the floor, confused, agitated, weak and tremulous, apparently having fallen from the couch. She had a similar episode of tremulousness, weakness and confusion about 4 days prior, this was when she was evaluated in the ED. Further history includes increased rectal drainage (she is s/p rectal cancer with diverting look ileostomy on January 07, 2013) along with increased output from the colostomy (4-5 times per day). The colostomy output today is loose and foul smelling. Her last antibiotics were during her admission for cancer at [facility]. Further, during the past week, she has had decreased PO intake to both solids and liquids due to feeling like she couldn't swallow, but no pain with swallowing. Her daughter also reports that she has had a personality change and has been less pleasant.  Pt suspected to have SIRS.    Assessment / Plan / Recommendation Clinical Impression  Pt presents with no overt signs of dysphagia or aspiration at bedside. She is on a clear liquid diet and appears to tolerate this well today when RR is acceptable. She does have higher risk of aspriation when respiratory rate is high. She and her daughter do report grimace and  throat/lingual pain since recent hospitalization. Pt now being treated for candida and reports improvement in oral sensitivity today. She does not complain of pain with swallowing during assessment. Discussed with Dr. Tyson Alias who recommends objective assessment due to concern for silent aspiration based on history. Pt and family in agreement. Will complete exam today at 10 am. Requested pt and family hold PO untial exam    Aspiration Risk  Moderate    Diet Recommendation NPO        Other  Recommendations Recommended Consults: MBS Oral Care Recommendations: Oral care Q4 per protocol   Follow Up Recommendations       Frequency and Duration        Pertinent Vitals/Pain NA    SLP Swallow Goals     Swallow Study Prior Functional Status       General HPI: Pt found her down on the floor, confused, agitated, weak and tremulous, apparently having fallen from the couch. She had a similar episode of tremulousness, weakness and confusion about 4 days prior, this was when she was evaluated in the ED. Further history includes increased rectal drainage (she is s/p rectal cancer with diverting look ileostomy on January 07, 2013) along with increased output from the colostomy (4-5 times per day). The colostomy output today is loose and foul smelling. Her last antibiotics were during her admission for cancer at [facility]. Further, during the past week, she has had decreased PO intake to both solids and liquids due to feeling like she couldn't  swallow, but no pain with swallowing. Her daughter also reports that she has had a personality change and has been less pleasant.  Pt suspected to have SIRS.  Type of Study: Bedside swallow evaluation Previous Swallow Assessment: none Diet Prior to this Study: Thin liquids Temperature Spikes Noted: Yes Respiratory Status: Nasal cannula History of Recent Intubation: No Behavior/Cognition: Alert;Cooperative;Pleasant mood Oral Cavity - Dentition: Adequate  natural dentition Self-Feeding Abilities: Able to feed self Patient Positioning: Upright in bed Baseline Vocal Quality: Clear Volitional Cough: Strong Volitional Swallow: Able to elicit    Oral/Motor/Sensory Function Overall Oral Motor/Sensory Function: Appears within functional limits for tasks assessed   Ice Chips Ice chips: Within functional limits Presentation: Spoon   Thin Liquid Thin Liquid: Within functional limits Presentation: Cup;Straw;Self Fed    Nectar Thick Nectar Thick Liquid: Not tested   Honey Thick Honey Thick Liquid: Not tested   Puree Puree: Within functional limits   Solid   GO    Solid: Not tested      Harlon Ditty, MA CCC-SLP [phone]  [name] 04/21/2013,9:14 AM

## 2013-04-21 NOTE — Progress Notes (Signed)
RT placed patient back on at 0300 12/6, 40%. Patient is tolerating bipap well at this time.

## 2013-04-21 NOTE — Procedures (Signed)
Objective Swallowing Evaluation: Modified Barium Swallowing Study  Patient Details  Name: EMARA LICHTER MRN: [id number] Date of Birth: Mar 20, 1928  Today's Date: 04/21/2013 Time: 1000-1030 SLP Time Calculation (min): 30 min  Past Medical History:  Past Medical History  Diagnosis Date  . Hypertension   . Stroke May 2013    mild stroke  . Hemorrhoids   . Cancer     Rectal adenocarcinoma   Past Surgical History:  Past Surgical History  Procedure Laterality Date  . Hemorrhoid surgery    . Colonoscopy    . Eus N/A 09/09/2012    Procedure: LOWER ENDOSCOPIC ULTRASOUND (EUS);  Surgeon: Willis Modena, MD;  Location: [facility];  Service: Endoscopy;  Laterality: N/A;  moderate sedation  . Rectal biopsy  09/03/2012    Suspicious Adenocarcinoma   HPI:  Pt found her down on the floor, confused, agitated, weak and tremulous, apparently having fallen from the couch. She had a similar episode of tremulousness, weakness and confusion about 4 days prior, this was when she was evaluated in the ED. Further history includes increased rectal drainage (she is s/p rectal cancer with diverting look ileostomy on January 07, 2013) along with increased output from the colostomy (4-5 times per day). The colostomy output today is loose and foul smelling. Her last antibiotics were during her admission for cancer at [facility]. Further, during the past week, she has had decreased PO intake to both solids and liquids due to feeling like she couldn't swallow, but no pain with swallowing. Her daughter also reports that she has had a personality change and has been less pleasant.  Pt suspected to have SIRS.      Assessment / Plan / Recommendation Clinical Impression  Dysphagia Diagnosis: Mild pharyngeal phase dysphagia Clinical impression: Pt demonstrates mild oropharyngeal dysphagia secondary to discoordintion of swallow/repsiration pattern; strength of musculature WNL. Oral phase WFL, able to masticate and transit  solids and puree without difficutly. When taking sips of thin liquids pt had intermittent trace penetration/aspiration before the swallow in 2 out of 20 trials. When cued to briefly hold small thin bolus orally and then actively swallow pt did not penetrate or aspirate. Pushed pt to larger consecutive sips with nectar thick with no penetration. Given that pt has fluctuating respiratory function, recommend conservative diet of Dys 3 (mechanical soft) nectar thick liquids. Pt may have sips of thin water or tea in between meals following oral care when respiratory function WNL. Anticipate that as respiratory function becomes consistent pt can safely upgrade to regular diet with thin liquids. Etiology of acute dysphagia suspected to be high respiratory rate and anxiety; chronic dysphagia resluting in an aspiration pna PTA unlikely. There may also be a component of esophageal dysphagia, sweep showed apperance of spasm, POs surging to cervical esophagus without pt awareness.     Treatment Recommendation  Therapy as outlined in treatment plan below    Diet Recommendation Dysphagia 3 (Mechanical Soft);Nectar-thick liquid   Liquid Administration via: Cup;Straw Medication Administration: Whole meds with puree Supervision: Patient able to self feed;Full supervision/cueing for compensatory strategies Compensations: Slow rate;Small sips/bites Postural Changes and/or Swallow Maneuvers: Seated upright 90 degrees;Out of bed for meals    Other  Recommendations Recommended Consults: MBS Oral Care Recommendations: Oral care before and after PO Other Recommendations: Order thickener from pharmacy   Follow Up Recommendations  None    Frequency and Duration min 2x/week  2 weeks   Pertinent Vitals/Pain NA    SLP Swallow Goals  General HPI: Pt found her down on the floor, confused, agitated, weak and tremulous, apparently having fallen from the couch. She had a similar episode of tremulousness, weakness and  confusion about 4 days prior, this was when she was evaluated in the ED. Further history includes increased rectal drainage (she is s/p rectal cancer with diverting look ileostomy on January 07, 2013) along with increased output from the colostomy (4-5 times per day). The colostomy output today is loose and foul smelling. Her last antibiotics were during her admission for cancer at [facility]. Further, during the past week, she has had decreased PO intake to both solids and liquids due to feeling like she couldn't swallow, but no pain with swallowing. Her daughter also reports that she has had a personality change and has been less pleasant.  Pt suspected to have SIRS.  Type of Study: Modified Barium Swallowing Study Reason for Referral: Objectively evaluate swallowing function Previous Swallow Assessment: none Diet Prior to this Study: NPO Temperature Spikes Noted: Yes Respiratory Status: Nasal cannula History of Recent Intubation: No Behavior/Cognition: Alert;Cooperative;Pleasant mood (anxious) Oral Cavity - Dentition: Adequate natural dentition Oral Motor / Sensory Function: Within functional limits Self-Feeding Abilities: Able to feed self Patient Positioning: Upright in chair Baseline Vocal Quality: Clear Volitional Cough: Strong Volitional Swallow: Able to elicit Anatomy: Within functional limits Pharyngeal Secretions: Not observed secondary MBS    Reason for Referral Objectively evaluate swallowing function   Oral Phase Oral Preparation/Oral Phase Oral Phase: WFL   Pharyngeal Phase Pharyngeal Phase Pharyngeal Phase: Impaired Pharyngeal - Nectar Pharyngeal - Nectar Cup: Within functional limits Pharyngeal - Thin Pharyngeal - Thin Cup: Delayed swallow initiation;Penetration/Aspiration before swallow;Trace aspiration Penetration/Aspiration details (thin cup): Material does not enter airway;Material enters airway, passes BELOW cords without attempt by patient to eject out (silent  aspiration);Material enters airway, CONTACTS cords and not ejected out Pharyngeal - Thin Straw: Within functional limits (with cues to orally hold bolus, small sips) Pharyngeal - Solids Pharyngeal - Puree: Within functional limits Pharyngeal - Regular: Within functional limits Pharyngeal - Pill: Within functional limits  Cervical Esophageal Phase    GO    Cervical Esophageal Phase Cervical Esophageal Phase: [facility]        Harlon Ditty, MA CCC-SLP [phone]  Rashiya Lofland, Riley Nearing 04/21/2013, 11:06 AM

## 2013-04-21 NOTE — Consult Note (Signed)
Reason for Consult: Concerns regarding aspiration Referring Physician: Critical care team  Breanna Guerrero is an 79 y.o. female.  HPI: Patient seen and examined and both office chart and hospital computer chart reviewed and case discussed with the patient and her daughter although with her BiPAP she could not participate very much in the conversation with the history was provided by the daughter who is also present for her modified barium swallow which we discussed. The patient has not had any previous upper tract symptoms or swallowing problems and has never had an endoscopy or barium swallow and has not been on pump inhibitor and other than her pneumonia and breathing does not have any specific complaints Past Medical History  Diagnosis Date  . Hypertension   . Stroke May 2013    mild stroke  . Hemorrhoids   . Cancer     Rectal adenocarcinoma    Past Surgical History  Procedure Laterality Date  . Hemorrhoid surgery    . Colonoscopy    . Eus N/A 09/09/2012    Procedure: LOWER ENDOSCOPIC ULTRASOUND (EUS);  Surgeon: Willis Modena, MD;  Location: [facility];  Service: Endoscopy;  Laterality: N/A;  moderate sedation  . Rectal biopsy  09/03/2012    Suspicious Adenocarcinoma    Family History  Problem Relation Age of Onset  . Breast cancer Sister   . Brain cancer Sister   . Leukemia Maternal Aunt     Social History:  reports that she has never smoked. She does not have any smokeless tobacco history on file. She reports that she does not drink alcohol or use illicit drugs.  Allergies:  Allergies  Allergen Reactions  . Sulfa Antibiotics   . Latex Rash    Medications: I have reviewed the patient's current medications.  Results for orders placed during the hospital encounter of 04/17/13 (from the past 48 hour(s))  POCT I-STAT 3, BLOOD GAS (G3+)     Status: Abnormal   Collection Time    04/19/13  9:51 PM      Result Value Range   pH, Arterial 7.187 (*) 7.350 - 7.450   pCO2 arterial  33.6 (*) 35.0 - 45.0 mmHg   pO2, Arterial 256.0 (*) 80.0 - 100.0 mmHg   Bicarbonate 12.7 (*) 20.0 - 24.0 mEq/L   TCO2 14  0 - 100 mmol/L   O2 Saturation 100.0     Acid-base deficit 14.0 (*) 0.0 - 2.0 mmol/L   Patient temperature 37.5 C     Collection site RADIAL, ALLEN'S TEST ACCEPTABLE     Drawn by RT     Sample type ARTERIAL     Comment NOTIFIED PHYSICIAN    POCT I-STAT 3, BLOOD GAS (G3+)     Status: Abnormal   Collection Time    04/20/13  3:30 AM      Result Value Range   pH, Arterial 7.526 (*) 7.350 - 7.450   pCO2 arterial 25.9 (*) 35.0 - 45.0 mmHg   pO2, Arterial 207.0 (*) 80.0 - 100.0 mmHg   Bicarbonate 21.4  20.0 - 24.0 mEq/L   TCO2 22  0 - 100 mmol/L   O2 Saturation 100.0     Acid-base deficit 1.0  0.0 - 2.0 mmol/L   Patient temperature 37.3 C     Collection site RADIAL, ALLEN'S TEST ACCEPTABLE     Drawn by RT     Sample type ARTERIAL    STREP PNEUMONIAE URINARY ANTIGEN     Status: None   Collection Time  04/20/13  9:29 AM      Result Value Range   Strep Pneumo Urinary Antigen NEGATIVE  NEGATIVE   Comment:            Infection due to S. pneumoniae     cannot be absolutely ruled out     since the antigen present     may be below the detection limit     of the test.  LEGIONELLA ANTIGEN, URINE     Status: None   Collection Time    04/20/13  9:29 AM      Result Value Range   Specimen Description URINE, CATHETERIZED     Special Requests NONE     Legionella Antigen, Urine       Value: Negative for Legionella pneumophilia serogroup 1     Performed at [facility]   Report Status 04/21/2013 FINAL    COMPREHENSIVE METABOLIC PANEL     Status: Abnormal   Collection Time    04/20/13  9:56 AM      Result Value Range   Sodium 139  135 - 145 mEq/L   Potassium 3.3 (*) 3.5 - 5.1 mEq/L   Comment: DELTA CHECK NOTED   Chloride 103  96 - 112 mEq/L   CO2 19  19 - 32 mEq/L   Glucose, Bld 185 (*) 70 - 99 mg/dL   BUN 52 (*) 6 - 23 mg/dL   Creatinine, Ser 1.61 (*)  0.50 - 1.10 mg/dL   Calcium 7.8 (*) 8.4 - 10.5 mg/dL   Total Protein 5.4 (*) 6.0 - 8.3 g/dL   Albumin 1.8 (*) 3.5 - 5.2 g/dL   AST 36  0 - 37 U/L   ALT 38 (*) 0 - 35 U/L   Alkaline Phosphatase 352 (*) 39 - 117 U/L   Total Bilirubin 3.7 (*) 0.3 - 1.2 mg/dL   GFR calc non Af Amer 25 (*) >90 mL/min   GFR calc Af Amer 29 (*) >90 mL/min   Comment: (NOTE)     The eGFR has been calculated using the CKD EPI equation.     This calculation has not been validated in all clinical situations.     eGFR's persistently <90 mL/min signify possible Chronic Kidney     Disease.  DIC (DISSEMINATED INTRAVASCULAR COAGULATION) PANEL     Status: Abnormal   Collection Time    04/20/13  9:56 AM      Result Value Range   Prothrombin Time 15.3 (*) 11.6 - 15.2 seconds   INR 1.24  0.00 - 1.49   aPTT 29  24 - 37 seconds   Fibrinogen 536 (*) 204 - 475 mg/dL   D-Dimer, Quant 0.96 (*) 0.00 - 0.48 ug/mL-FEU   Comment:            AT THE INHOUSE ESTABLISHED CUTOFF     VALUE OF 0.48 ug/mL FEU,     THIS ASSAY HAS BEEN DOCUMENTED     IN THE LITERATURE TO HAVE     A SENSITIVITY AND NEGATIVE     PREDICTIVE VALUE OF AT LEAST     98 TO 99%.  THE TEST RESULT     SHOULD BE CORRELATED WITH     AN ASSESSMENT OF THE CLINICAL     PROBABILITY OF DVT / VTE.   Platelets 39 (*) 150 - 400 K/uL   Comment: REPEATED TO VERIFY     DELTA CHECK NOTED   Smear Review NO SCHISTOCYTES SEEN    CBC  WITH DIFFERENTIAL     Status: Abnormal   Collection Time    04/20/13  9:56 AM      Result Value Range   WBC 12.1 (*) 4.0 - 10.5 K/uL   RBC 3.20 (*) 3.87 - 5.11 MIL/uL   Hemoglobin 8.7 (*) 12.0 - 15.0 g/dL   HCT 47.8 (*) 29.5 - 62.1 %   MCV 75.0 (*) 78.0 - 100.0 fL   MCH 27.2  26.0 - 34.0 pg   MCHC 36.3 (*) 30.0 - 36.0 g/dL   RDW 30.8 (*) 65.7 - 84.6 %   Platelets 46 (*) 150 - 400 K/uL   Comment: CONSISTENT WITH PREVIOUS RESULT   Neutrophils Relative % 95 (*) 43 - 77 %   Neutro Abs 11.5 (*) 1.7 - 7.7 K/uL   Lymphocytes Relative 3 (*)  12 - 46 %   Lymphs Abs 0.4 (*) 0.7 - 4.0 K/uL   Monocytes Relative 2 (*) 3 - 12 %   Monocytes Absolute 0.2  0.1 - 1.0 K/uL   Eosinophils Relative 0  0 - 5 %   Eosinophils Absolute 0.0  0.0 - 0.7 K/uL   Basophils Relative 0  0 - 1 %   Basophils Absolute 0.0  0.0 - 0.1 K/uL  POCT I-STAT 3, BLOOD GAS (G3+)     Status: Abnormal   Collection Time    04/20/13  5:51 PM      Result Value Range   pH, Arterial 7.368  7.350 - 7.450   pCO2 arterial 36.8  35.0 - 45.0 mmHg   pO2, Arterial 86.0  80.0 - 100.0 mmHg   Bicarbonate 21.0  20.0 - 24.0 mEq/L   TCO2 22  0 - 100 mmol/L   O2 Saturation 96.0     Acid-base deficit 4.0 (*) 0.0 - 2.0 mmol/L   Patient temperature 37.6 C     Collection site RADIAL, ALLEN'S TEST ACCEPTABLE     Drawn by RT     Sample type ARTERIAL    BASIC METABOLIC PANEL     Status: Abnormal   Collection Time    04/20/13  6:20 PM      Result Value Range   Sodium 144  135 - 145 mEq/L   Potassium 3.8  3.5 - 5.1 mEq/L   Chloride 111  96 - 112 mEq/L   CO2 23  19 - 32 mEq/L   Glucose, Bld 76  70 - 99 mg/dL   BUN 9  6 - 23 mg/dL   Comment: DELTA CHECK NOTED   Creatinine, Ser 0.81  0.50 - 1.10 mg/dL   Comment: DELTA CHECK NOTED   Calcium 8.2 (*) 8.4 - 10.5 mg/dL   GFR calc non Af Amer 65 (*) >90 mL/min   GFR calc Af Amer 76 (*) >90 mL/min   Comment: (NOTE)     The eGFR has been calculated using the CKD EPI equation.     This calculation has not been validated in all clinical situations.     eGFR's persistently <90 mL/min signify possible Chronic Kidney     Disease.  CBC WITH DIFFERENTIAL     Status: Abnormal   Collection Time    04/21/13  1:00 PM      Result Value Range   WBC 14.5 (*) 4.0 - 10.5 K/uL   RBC 2.75 (*) 3.87 - 5.11 MIL/uL   Hemoglobin 7.7 (*) 12.0 - 15.0 g/dL   HCT 96.2 (*) 95.2 - 84.1 %   MCV 76.0 (*)  78.0 - 100.0 fL   MCH 28.0  26.0 - 34.0 pg   MCHC 36.8 (*) 30.0 - 36.0 g/dL   RDW 16.1 (*) 09.6 - 04.5 %   Platelets 37 (*) 150 - 400 K/uL   Comment:  CONSISTENT WITH PREVIOUS RESULT   Neutrophils Relative % 93 (*) 43 - 77 %   Neutro Abs 13.4 (*) 1.7 - 7.7 K/uL   Lymphocytes Relative 4 (*) 12 - 46 %   Lymphs Abs 0.5 (*) 0.7 - 4.0 K/uL   Monocytes Relative 3  3 - 12 %   Monocytes Absolute 0.4  0.1 - 1.0 K/uL   Eosinophils Relative 0  0 - 5 %   Eosinophils Absolute 0.1  0.0 - 0.7 K/uL   Basophils Relative 0  0 - 1 %   Basophils Absolute 0.0  0.0 - 0.1 K/uL    US Abdomen Complete  04/19/2013   CLINICAL DATA:  Question cholecystitis.  EXAM: ULTRASOUND ABDOMEN COMPLETE  COMPARISON:  CT 04/18/2013  FINDINGS: Gallbladder  6 mm gallstone noted within the fundus of the gallbladder. No wall thickening scratch stab borderline wall thickness at 3.3 mm. No sonographic Murphy's sign. No pericholecystic fluid.  Common bile duct  Diameter: Normal caliber, 4 mm.  Liver  No focal lesion identified. Within normal limits in parenchymal echogenicity.  IVC  No abnormality visualized.  Pancreas  Visualized portion unremarkable.  Spleen  Size and appearance within normal limits.  Right Kidney  Length: 9.6 cm. Echogenicity within normal limits. No mass or hydronephrosis visualized.  Left Kidney  Length: 12.1 cm. Echogenicity within normal limits. No mass or hydronephrosis visualized.  Abdominal aorta  No aneurysm visualized.  IMPRESSION: 6 mm gallstone noted within the fundus of the gallbladder. Gallbladder wall is slightly thickened at 3.3 mm without pericholecystic fluid or sonographic Murphy's sign. This could reflect changes of chronic cholecystitis. If clinical concern for acute cholecystitis persists, nuclear medicine hepatobiliary scan may be helpful.   Electronically Signed   By: Charlett Nose M.D.   On: 04/19/2013 16:18   Dg Chest Port 1 View  04/21/2013   CLINICAL DATA:  Pneumonia.  EXAM: PORTABLE CHEST - 1 VIEW  COMPARISON:  04/20/2013  FINDINGS: Right jugular central venous catheter is present with tip overlying the lower SVC, unchanged. Cardiomediastinal  silhouette is within normal limits. Pulmonary edema has slightly improved from the prior study. There is persistent retrocardiac opacity in the left lung base, which may reflect atelectasis versus infectious infiltrate. Trace bilateral pleural effusions are questioned. No acute osseous abnormality.  IMPRESSION: 1. Slightly improved pulmonary edema. 2. Persistent left basilar opacity, atelectasis versus infection. 3. Query trace bilateral pleural effusions.   Electronically Signed   By: Sebastian Ache   On: 04/21/2013 14:09   Dg Chest Port 1 View  04/20/2013   CLINICAL DATA:  Right jugular line placement  EXAM: PORTABLE CHEST - 1 VIEW  COMPARISON:  Portable exam 1457 hr compared to 0539 hr  FINDINGS: New right jugular central venous catheter with tip projecting over mid SVC.  Normal heart size and mediastinal contours.  Pulmonary vascular congestion.  Increased pulmonary edema since previous exam particularly in the upper lobes.  Small left pleural effusion.  No pneumothorax post line insertion; skin fold noted projecting over right chest.  Bones demineralized.  IMPRESSION: No pneumothorax following right jugular line insertion.  Increased pulmonary edema.   Electronically Signed   By: Ulyses Southward M.D.   On: 04/20/2013 15:18   Dg  Chest Port 1 View  04/20/2013   CLINICAL DATA:  Respiratory failure  EXAM: PORTABLE CHEST - 1 VIEW  COMPARISON:  04/19/2013; 04/18/2013; chest CT -09/04/2012  FINDINGS: Grossly unchanged borderline enlarged cardiac silhouette and mediastinal contours. The pulmonary vasculature remains indistinct with cephalization of flow, worse within the right upper lung. Bibasilar heterogeneous opacities are unchanged, left greater than right. Grossly unchanged trace bilateral effusions. No pneumothorax. Unchanged bones.  IMPRESSION: Grossly unchanged findings of asymmetric pulmonary edema and bibasilar opacities, left greater than right, atelectasis versus infiltrate. A follow-up chest radiograph  in 4 to 6 weeks after treatment is recommended to ensure resolution.   Electronically Signed   By: Simonne Come M.D.   On: 04/20/2013 07:53   Dg Chest Port 1 View  04/19/2013   CLINICAL DATA:  Pulmonary edema  EXAM: PORTABLE CHEST - 1 VIEW  COMPARISON:  the previous day's study  FINDINGS: Little interval change in asymmetric interstitial edema or infiltrates with relative sparing in the left upper lung. Small pleural effusions persist. Heart size upper limits normal. Regional bones unremarkable.  IMPRESSION: Stable asymmetric edema or infiltrates with small effusions.   Electronically Signed   By: Oley Balm M.D.   On: 04/19/2013 22:08   Dg Swallowing Func-speech Pathology  04/21/2013   Riley Nearing [place]blois, CCC-SLP     04/21/2013 11:08 AM Objective Swallowing Evaluation: Modified Barium Swallowing Study   Patient Details  Name: Breanna Guerrero MRN: [id number] Date of Birth: [birth year]  Today's Date: 04/21/2013 Time: 1000-1030 SLP Time Calculation (min): 30 min  Past Medical History:  Past Medical History  Diagnosis Date  . Hypertension   . Stroke May 2013    mild stroke  . Hemorrhoids   . Cancer     Rectal adenocarcinoma   Past Surgical History:  Past Surgical History  Procedure Laterality Date  . Hemorrhoid surgery    . Colonoscopy    . Eus N/A 09/09/2012    Procedure: LOWER ENDOSCOPIC ULTRASOUND (EUS);  Surgeon: Willis Modena, MD;  Location: [facility];  Service: Endoscopy;   Laterality: N/A;  moderate sedation  . Rectal biopsy  09/03/2012    Suspicious Adenocarcinoma   HPI:  Pt found her down on the floor, confused, agitated, weak and  tremulous, apparently having fallen from the couch. She had a  similar episode of tremulousness, weakness and confusion about 4  days prior, this was when she was evaluated in the ED. Further  history includes increased rectal drainage (she is s/p rectal  cancer with diverting look ileostomy on January 07, 2013) along  with increased output from the colostomy (4-5 times per  day). The  colostomy output today is loose and foul smelling. Her last  antibiotics were during her admission for cancer at [facility] Further, during the past week, she has had decreased PO  intake to both solids and liquids due to feeling like she  couldn't swallow, but no pain with swallowing. Her daughter also  reports that she has had a personality change and has been less  pleasant.  Pt suspected to have SIRS.      Assessment / Plan / Recommendation Clinical Impression  Dysphagia Diagnosis: Mild pharyngeal phase dysphagia Clinical impression: Pt demonstrates mild oropharyngeal dysphagia  secondary to discoordintion of swallow/repsiration pattern;  strength of musculature WNL. Oral phase WFL, able to masticate  and transit solids and puree without difficutly. When taking sips  of thin liquids pt had intermittent trace penetration/aspiration  before the swallow  in 2 out of 20 trials. When cued to briefly  hold small thin bolus orally and then actively swallow pt did not  penetrate or aspirate. Pushed pt to larger consecutive sips with  nectar thick with no penetration. Given that pt has fluctuating  respiratory function, recommend conservative diet of Dys 3  (mechanical soft) nectar thick liquids. Pt may have sips of thin  water or tea in between meals following oral care when  respiratory function WNL. Anticipate that as respiratory function  becomes consistent pt can safely upgrade to regular diet with  thin liquids. Etiology of acute dysphagia suspected to be high  respiratory rate and anxiety; chronic dysphagia resluting in an  aspiration pna PTA unlikely. There may also be a component of  esophageal dysphagia, sweep showed apperance of spasm, POs  surging to cervical esophagus without pt awareness.     Treatment Recommendation  Therapy as outlined in treatment plan below    Diet Recommendation Dysphagia 3 (Mechanical Soft);Nectar-thick  liquid   Liquid Administration via: Cup;Straw Medication  Administration: Whole meds with puree Supervision: Patient able to self feed;Full supervision/cueing  for compensatory strategies Compensations: Slow rate;Small sips/bites Postural Changes and/or Swallow Maneuvers: Seated upright 90  degrees;Out of bed for meals    Other  Recommendations Recommended Consults: MBS Oral Care Recommendations: Oral care before and after PO Other Recommendations: Order thickener from pharmacy   Follow Up Recommendations  None    Frequency and Duration min 2x/week  2 weeks   Pertinent Vitals/Pain NA    SLP Swallow Goals     General HPI: Pt found her down on the floor, confused, agitated,  weak and tremulous, apparently having fallen from the couch. She  had a similar episode of tremulousness, weakness and confusion  about 4 days prior, this was when she was evaluated in the ED.  Further history includes increased rectal drainage (she is s/p  rectal cancer with diverting look ileostomy on January 07, 2013)  along with increased output from the colostomy (4-5 times per  day). The colostomy output today is loose and foul smelling. Her  last antibiotics were during her admission for cancer at [facility]. Further, during the past week, she has had decreased PO  intake to both solids and liquids due to feeling like she  couldn't swallow, but no pain with swallowing. Her daughter also  reports that she has had a personality change and has been less  pleasant.  Pt suspected to have SIRS.  Type of Study: Modified Barium Swallowing Study Reason for Referral: Objectively evaluate swallowing function Previous Swallow Assessment: none Diet Prior to this Study: NPO Temperature Spikes Noted: Yes Respiratory Status: Nasal cannula History of Recent Intubation: No Behavior/Cognition: Alert;Cooperative;Pleasant mood (anxious) Oral Cavity - Dentition: Adequate natural dentition Oral Motor / Sensory Function: Within functional limits Self-Feeding Abilities: Able to feed self Patient Positioning: Upright  in chair Baseline Vocal Quality: Clear Volitional Cough: Strong Volitional Swallow: Able to elicit Anatomy: Within functional limits Pharyngeal Secretions: Not observed secondary MBS    Reason for Referral Objectively evaluate swallowing function   Oral Phase Oral Preparation/Oral Phase Oral Phase: WFL   Pharyngeal Phase Pharyngeal Phase Pharyngeal Phase: Impaired Pharyngeal - Nectar Pharyngeal - Nectar Cup: Within functional limits Pharyngeal - Thin Pharyngeal - Thin Cup: Delayed swallow  initiation;Penetration/Aspiration before swallow;Trace aspiration Penetration/Aspiration details (thin cup): Material does not  enter airway;Material enters airway, passes BELOW cords without  attempt by patient to eject out (silent aspiration);Material  enters airway,  CONTACTS cords and not ejected out Pharyngeal - Thin Straw: Within functional limits (with cues to  orally hold bolus, small sips) Pharyngeal - Solids Pharyngeal - Puree: Within functional limits Pharyngeal - Regular: Within functional limits Pharyngeal - Pill: Within functional limits  Cervical Esophageal Phase    GO    Cervical Esophageal Phase Cervical Esophageal Phase: [facility]        Harlon Ditty, MA CCC-SLP [phone]  DeBlois, Riley Nearing 04/21/2013, 11:06 AM     ROS negative except above Blood pressure 102/71, pulse 101, temperature 99.5 F (37.5 C), temperature source Core (Comment), resp. rate 34, height 5\' 2"  (1.575 m), weight 55.5 kg (122 lb 5.7 oz), SpO2 98.00%. Physical Exam patient not examined today currently stable resting comfortably in the bed on BiPAP swallowing eval reviewed  Assessment/Plan: Probable aspiration pneumonia probably multifactorial in an elderly patient with multiple medical problem Plan: Agree with pump inhibitors for now and following swallowing team recommendations and happy to see back when necessary and consider a barium swallow with barium or endoscopy at some point in the future if needed and we are also happy to  see her as an outpatient or when she is better from a respiratory standpoint and all the above was discussed with the patient and the daughter who agrees [facility] E 04/21/2013, 3:08 PM

## 2013-04-21 NOTE — Progress Notes (Addendum)
Physical Therapy Treatment Patient Details Name: Breanna Guerrero MRN: [id number] DOB: November 13, 1927 Today's Date: 04/21/2013 Time: 7829-5621 PT Time Calculation (min): 24 min  PT Assessment / Plan / Recommendation  History of Present Illness Pt is a 79 y.o. female adm secondary to fall and dehydration. Found to have 2 remote fractures by CT 04/18/2013 ( Incompletely healed fractures of the right side of the symphysis pubis and right sacral ala)   PT Comments   Pt with depressed spirits due to decreased independence however tolerated OOB transfer to chair well this date. Spoke extensively regarding benefits of OOB mobility and LE there ex to improve activity tolerance and progress mobility. Pt to con't to benefit from SNF placement to address mentioned deficits and achieve maximal functional recovery for safe transition home.   Follow Up Recommendations  SNF     Does the patient have the potential to tolerate intense rehabilitation     Barriers to Discharge        Equipment Recommendations       Recommendations for Other Services    Frequency Min 3X/week   Progress towards PT Goals Progress towards PT goals: Progressing toward goals  Plan Discharge plan needs to be updated    Precautions / Restrictions Precautions Precautions: Fall Restrictions Weight Bearing Restrictions: No   Pertinent Vitals/Pain Denies pain    Mobility  Bed Mobility Bed Mobility: Supine to Sit;Sitting - Scoot to Edge of Bed Supine to Sit: 3: Mod assist;HOB elevated Sitting - Scoot to Edge of Bed: 3: Mod assist Details for Bed Mobility Assistance: assist for trunk elevation, able to initiate LE mvmt Transfers Transfers: Sit to Stand;Stand to Sit Sit to Stand: 2: Max assist;With upper extremity assist;From bed Stand to Sit: 4: Min assist;With upper extremity assist;To chair/3-in-1 Details for Transfer Assistance: v/c's for safe hand placement, assist for anterior weight shift and to achieve full upright  standing Ambulation/Gait Ambulation/Gait Assistance: 3: Mod assist Ambulation Distance (Feet): 3 Feet Assistive device: Rolling walker Ambulation/Gait Assistance Details: max encouragement to amb, minA for walker management, short shuffled steps Gait Pattern: Step-to pattern;Decreased stride length;Shuffle Gait velocity: slow Stairs: No    Exercises General Exercises - Lower Extremity Ankle Circles/Pumps: AROM;Both;10 reps;Seated   PT Diagnosis:    PT Problem List:   PT Treatment Interventions:     PT Goals (current goals can now be found in the care plan section)    Visit Information  Last PT Received On: 04/21/13 Assistance Needed: +2 (for lines and chair follow) History of Present Illness: Pt is a 79 y.o. female adm secondary to fall and dehydration. Found to have 2 remote fractures by CT 04/18/2013 ( Incompletely healed fractures of the right side of the symphysis pubis and right sacral ala)    Subjective Data  Subjective: pt reports "I'm not doing very good"   Cognition  Cognition Arousal/Alertness: Awake/alert Behavior During Therapy: WFL for tasks assessed/performed Overall Cognitive Status: Within Functional Limits for tasks assessed General Comments: pt with depressed spirits    Balance     End of Session PT - End of Session Equipment Utilized During Treatment: Gait belt;Oxygen;Other (comment) Activity Tolerance: Patient limited by fatigue;Patient limited by pain Patient left: in chair;with call bell/phone within reach;with family/visitor present Nurse Communication: Mobility status   GP     Marcene Brawn 04/21/2013, 3:59 PM  Lewis Shock, PT, DPT Pager #: [phone] Office #: [phone]

## 2013-04-21 NOTE — Progress Notes (Signed)
eLink Physician-Brief Progress Note Patient Name: STEPHINE LANGBEHN DOB: 1928-04-02 MRN: [id number]  Date of Service  04/21/2013   HPI/Events of Note     eICU Interventions  Hypokalemia -repleted Hold next dose (MN ) of lasix until K repleted   Intervention Category Intermediate Interventions: Electrolyte abnormality - evaluation and management  Rochelle Larue V. 04/21/2013, 9:27 PM

## 2013-04-21 NOTE — Progress Notes (Signed)
Pt. wants bipap off for now, placed on O2 at 4l cannula, repositioned for comfort. Will cont. to monitor respiratory status.

## 2013-04-21 NOTE — Progress Notes (Signed)
Family at the bedside and stated pt. wants unsweetened tea, they said that they been giving her some fluids earlier, called Dr. Marchelle Gearing notified for diet order, RN explained to family the importance of nothing by mouth due pts. respiratory status and prevent complication especially aspiration. Family verbalized understanding they said they will give her just  spoonful of tea and ice chips.

## 2013-04-21 NOTE — Progress Notes (Addendum)
PULMONARY  / CRITICAL CARE MEDICINE  Name: Breanna Guerrero MRN: [id number] DOB: 1927-08-03    ADMISSION DATE:  04/17/2013 CONSULTATION DATE:  04/18/13  REFERRING MD :  Danise Edge  PRIMARY SERVICE: IMTS   CHIEF COMPLAINT:  Hypotension   BRIEF PATIENT DESCRIPTION:  89 F with recent dx of rectal cancer s/p resection w/ ileostomy 01/2013 at [facility] . Chemo/XRT completed by Dr Truett Perna 10/2012. Transferred from outside hospital for hypovolemia and acute renal failure . B/P remained low despite fluid challenge. Moved to ICU and PCCM assumed care 11/16  SIGNIFICANT EVENTS / STUDIES:  11/16 Transfer to ICU for persistent hypotension 11/16 Suspected transfusion reaction with dyspnea requiring NPPV 11/16 CT abd:  post surgical changes, incompletely healed fracture of R sacrum and pubic symphysis  11/17 AFRVR > amiodarone 11/17 abdo US>>> 6 mm gallstone noted within the fundus of the gallbladder.<BR>Gallbladder wall is slightly thickened at 3.3 mm without, pericholecystic fluid or sonographic Murphy's sign 11/17 asymmetric LE edema > LE venous Dopplers:  11/17 Abn LFTs > RUQ [place]:  Prelim negative 11/17: Bipap required, distress 11/18: Swallow eval: recommended nectar thick liquids 11/19- still requires NIMV  LINES / TUBES: 11/18:Right IJ CVC>>>  CULTURES: 11/15 C Diff>> NEG 11/16 BC x2 >> pending 11/16 Urine >> No growth  ANTIBIOTICS: 11/16 Zosyn >> 11/18 vanc>>> 11/18 diflucan ( empiric esophagitis)>>>  SUBJECTIVE:   Appears anxious and distressed at times which drives rapid respirations  VITAL SIGNS: Temp:  [97.3 F (36.3 C)-100.4 F (38 C)] 98.2 F (36.8 C) (11/19 0900) Pulse Rate:  [70-139] 70 (11/19 0900) Resp:  [20-40] 38 (11/19 0900) BP: (80-137)/(41-75) 128/64 mmHg (11/19 1117) SpO2:  [93 %-100 %] 97 % (11/19 0900) FiO2 (%):  [4 %-40 %] 40 % (11/19 1117) HEMODYNAMICS: CVP:  [3 mmHg-24 mmHg] 5 mmHg VENTILATOR SETTINGS: Vent Mode:  [-] BIPAP FiO2 (%):  [4 %-40 %] 40 % INTAKE /  OUTPUT: Intake/Output     11/18 0701 - 11/19 0700 11/19 0701 - 11/20 0700   P.O. 150    I.V. (mL/kg) 618.6 (11.1) 40 (0.7)   IV Piggyback 400    Total Intake(mL/kg) 1168.6 (21.1) 40 (0.7)   Urine (mL/kg/hr) 1615 (1.2) 100 (0.4)   Stool 50 (0)    Total Output 1665 100   Net -496.4 -60          PHYSICAL EXAMINATION: General:  Elderly female, anxious, alert, tachypnic off BIPAP Neuro:  Alert, following commands, moves extremities x 4  HEENT:  MM pink and moist  Cardiovascular:  s1 s2 RRR m/r/g  Lungs:  Reduced ronchi rt , dependent rales, expiratory wheezes Abdomen:  Soft,  Hyperactive BS +, ostomy site is viable, no induration.  Mild generalized tenderness Musculoskeletal:  BLE edema R>L Skin:  Pale   LABS: I have reviewed all of today's lab results. Relevant abnormalities are discussed in the A/P section  CXR: int edema? infiltrate Rt greater left , roation ( 11/18)  ASSESSMENT / PLAN:  PULMONARY A:  Tachypnea remains, regardless of correction of ph Correction NON AG  With bicarb concern Asp PNA/ silent aspiration P:   Cont supplemental O2 Cont scheduled NPPV x 24 hrs, try to increase time off BIPAP HCO3 infusion  D/c'd 11/18 pcxr 11/20 and now CVP monitoring ( 5 on 11/19 am) SLP hints toward asp  CARDIOVASCULAR A:  Hypotension - suspect hypovolemic New onset AF with RVR (11/17) - sepsis related likely RLE swelling -neg dvt P:  Trop negative Amiodarone started 11/18, but  d/c'd 2/2 hallucinations, will re evaluate restart in am vs other agents Consider cards evaluation for additional agent, dig? Central line placed 11/18 for CVP monitoring, helpful as cvp 12, lasix provided Increase lasix frequency as only 400 cc negative BLE dopplers negative for DVT or Baker's Cyst Echo pending  RENAL A:   AKI, nonoliguric Mixed met acidosis P:   May need further correction NON AG in future, not now as ostomy improved Monitor ABG's as needed, avoid as able, DNI  noted Monitor BMET intermittently Trend BMETS Monitor ostomy output for need to restart bicarb  GASTROINTESTINAL A: S/P resection of rectal Ca with ileostomy 8/14 High ostomy output Prot-cal malnutrition w/ low albumin and anorexia  R/o esophagitis, candidia Question reflux/distal esophogeal spasm per SLP P:   SUP: IV PPI (indication - thrombocytopenia) to bid Dysphagia III diet per speech Consider GI for spasm treatment options? RUQ ultrasound 11/17 neg continue empiric diflucan Monitor for reflux  HEMATOLOGIC A:   Anemia without overt bleeding Thrombocytopenia (sepsis) Likely dilutional component to plat as well P:  Monitor CBC intermittently, in am and now Hgb goal >7 Monitor for s/s active bleeding SCDs prophylaxis Treat sepsis  INFECTIOUS A:  Severe sepsis - unknown source  Markedly elevated PCT Leukocytosis with left shift- improving 11/19 P:   Micro and abx as above  vanc added 11/18, dc in am if no MRSa, or enteroccus empiric diflucan 11/18 Trend WBC Trend fever curve  ENDOCRINE A:   Mild hyperglycemia P:   Monitor glu on chem profiles Begin SSI for glu > 180  NEUROLOGIC A:  Encephalopathy, resolved Physical deconditioning P:   Support as needed Minimize sedating medications PT/OP When able  TODAY'S SUMMARY:   ASP PNA? Per speech questionable distal esophogeal spasm which may be factor in reflux??( Not diagnostic) Output from ostomy significantly less, will see if increases with diet advancement. Anxiety may be driving respiratory rate/ limiting time off BIPAP. Gi consult Cards for other agent for fib, amio off as hallucination   Scribed by Kandice Robinsons, RN, ACNP  Student  [facility] For Canary Brim, NP-C   Ccm time 30 min   Mcarthur Rossetti. Tyson Alias, MD, FACP Pgr: [phone] [facility]

## 2013-04-21 NOTE — Clinical Social Work Note (Addendum)
Clinical Social Work Department BRIEF PSYCHOSOCIAL ASSESSMENT 04/20/2013  Patient:  Breanna Guerrero, Breanna Guerrero     Account Number:  [id number]     Admit date:  04/17/2013  Clinical Social Worker:  Verl Blalock  Date/Time:  04/20/2013 02:20 PM  Referred by:  RN  Date Referred:  04/20/2013 Referred for  SNF Placement   Other Referral:   Interview type:  Patient Other interview type:   Patient daughter at bedside    PSYCHOSOCIAL DATA Living Status:  HUSBAND Admitted from facility:   Level of care:   Primary support name:  Breanna Guerrero  [phone] / [phone] Primary support relationship to patient:  CHILD, ADULT Degree of support available:   Strong - patient spouse currently at inpatient rehab    CURRENT CONCERNS Current Concerns  Post-Acute Placement   Other Concerns:    SOCIAL WORK ASSESSMENT / PLAN Clinical Social Worker met with patient and patient daughter at bedside to offer support and discuss patient needs at discharge.  Patient daughter states that patient and patient spouse live in [place] and have been here for several weeks due to patient spouse recent admission. Patient spouse was accepted to inpatient rehab and patient was staying with her daughter in [place].  Patient then required hospitalization as well.  Patient and patient spouse are both now in need of SNF placement in [facility].  Patient and patient daughter are both in agreement with initiating search and finding placement with patient spouse.    CSW spoke with Dossie Der (inpatient rehab CSW) who is working on placement for patient spouse.  Patient is not yet medically ready, however Kriste Basque is aware that both patient and spouse are in need of placement and will begin searching in the area for a facility that can accommodate both patients.  CSW to complete FL2 and initiate search, so facilities will have necessary information for dual placement.  CSW remains available for support and to facilitate patient  needs for discharge.   Assessment/plan status:  Psychosocial Support/Ongoing Assessment of Needs Other assessment/ plan:   Information/referral to community resources:   Visual merchandiser offered facility list, however patient daughter in law has facility list for patient spouse placement.  Patient daughter in law working diligently with inpatient rehab to assist with placement.    PATIENT'S/FAMILY'S RESPONSE TO PLAN OF CARE: Patient alert and oriented x3 sitting up in bed, however seemed to be very short of breath.  Patient daughter provided most of the information with patient having very little engagement in the assessment process.  Patient family is very supportive, however unable to provide 24 hour support at discharge.  Patient and patient spouse remain agreeable to SNF placement as long as they are able together.  Patient and family aware of CSW role and appreciative of support.

## 2013-04-21 NOTE — Clinical Social Work Placement (Addendum)
Clinical Social Work Department CLINICAL SOCIAL WORK PLACEMENT NOTE 04/21/2013  Patient:  ELEXA, KIVI  Account Number:  [id number] Admit date:  04/17/2013  Clinical Social Worker:  Macario Golds, LCSW  Date/time:  04/21/2013 10:00 AM  Clinical Social Work is seeking post-discharge placement for this patient at the following level of care:   SKILLED NURSING   (*CSW will update this form in Epic as items are completed)   04/21/2013  Patient/family provided with [facility] Department of Clinical Social Work's list of facilities offering this level of care within the geographic area requested by the patient (or if unable, by the patient's family).  04/21/2013  Patient/family informed of their freedom to choose among providers that offer the needed level of care, that participate in Medicare, Medicaid or managed care program needed by the patient, have an available bed and are willing to accept the patient.  04/21/2013  Patient/family informed of [facility] ownership interest [facility], as well as of the fact that they are under no obligation to receive care at this facility.  PASARR submitted to EDS on 04/21/2013 PASARR number received from EDS on   FL2 transmitted to all facilities in geographic area requested by pt/family on   FL2 transmitted to all facilities within larger geographic area on   Patient informed that his/her managed care company has contracts with or will negotiate with  certain facilities, including the following:     Patient/family informed of bed offers received:   Patient chooses bed at [facility] Physician recommends and patient chooses bed at    Patient to be transferred to  on  04/30/2013 Patient to be transferred to facility by [facility]  The following physician request were entered in Epic:   Additional Comments: 11/19 - Patient currently on BiPap - CSW to initiate search once patient able to remain off BiPap

## 2013-04-21 NOTE — Progress Notes (Signed)
Pts  BP was low, SBP< 90 still in and out of afib, Dr. Craige Cotta made aware of BP will continue to monitor.

## 2013-04-22 ENCOUNTER — Inpatient Hospital Stay (HOSPITAL_COMMUNITY): Payer: Medicare Other

## 2013-04-22 DIAGNOSIS — J189 Pneumonia, unspecified organism: Secondary | ICD-10-CM

## 2013-04-22 LAB — CBC
HCT: 19.3 % — ABNORMAL LOW (ref 36.0–46.0)
MCH: 27.3 pg (ref 26.0–34.0)
MCHC: 36.3 g/dL — ABNORMAL HIGH (ref 30.0–36.0)
MCV: 75.4 fL — ABNORMAL LOW (ref 78.0–100.0)
Platelets: 42 10*3/uL — ABNORMAL LOW (ref 150–400)
RBC: 2.56 MIL/uL — ABNORMAL LOW (ref 3.87–5.11)
RDW: 15.9 % — ABNORMAL HIGH (ref 11.5–15.5)
WBC: 12.4 10*3/uL — ABNORMAL HIGH (ref 4.0–10.5)

## 2013-04-22 LAB — GLUCOSE, CAPILLARY: Glucose-Capillary: 140 mg/dL — ABNORMAL HIGH (ref 70–99)

## 2013-04-22 LAB — BASIC METABOLIC PANEL
CO2: 25 mEq/L (ref 19–32)
Chloride: 104 mEq/L (ref 96–112)
Creatinine, Ser: 1.45 mg/dL — ABNORMAL HIGH (ref 0.50–1.10)
GFR calc Af Amer: 37 mL/min — ABNORMAL LOW (ref >90)
Glucose, Bld: 143 mg/dL — ABNORMAL HIGH (ref 70–99)
Sodium: 140 mEq/L (ref 135–145)

## 2013-04-22 NOTE — Progress Notes (Signed)
Patient ID: Breanna Guerrero, female   DOB: 1928/03/01, 79 y.o.   MRN: [id number]    Subjective:  Wants more oral intake Just having ice now  Objective:  Filed Vitals:   04/22/13 0300 04/22/13 0400 04/22/13 0401 04/22/13 0500  BP:  104/65  122/62  Pulse: 82 79  83  Temp:   97.7 F (36.5 C)   TempSrc:   Oral   Resp: 27 22  29   Height:      Weight:      SpO2: 99% 98%  97%    Intake/Output from previous day:  Intake/Output Summary (Last 24 hours) at 04/22/13 0751 Last data filed at 04/22/13 0600  Gross per 24 hour  Intake   1413 ml  Output   3625 ml  Net  -2212 ml    Physical Exam: Frail thin female Crackles and rales bilaterally Normal heart sounds Ileostomy Plus 2 PT No edema Neuro non focal  Lab Results: Basic Metabolic Panel:  Recent Labs  40/98/11 1800 04/22/13 0335  NA 139 140  K 2.5* 3.4*  CL 101 104  CO2 23 25  GLUCOSE 212* 143*  BUN 43* 40*  CREATININE 1.50* 1.45*  CALCIUM 7.8* 7.6*   Liver Function Tests:  Recent Labs  04/20/13 0956  AST 36  ALT 38*  ALKPHOS 352*  BILITOT 3.7*  PROT 5.4*  ALBUMIN 1.8*   CBC:  Recent Labs  04/20/13 0956 04/21/13 1300 04/22/13 0335  WBC 12.1* 14.5* 12.4*  NEUTROABS 11.5* 13.4*  --   HGB 8.7* 7.7* 7.0*  HCT 24.0* 20.9* 19.3*  MCV 75.0* 76.0* 75.4*  PLT 39*  46* 37* 42*   D-Dimer:  Recent Labs  04/20/13 0956  DDIMER 8.20*    Imaging:  Cardiac Studies:  ECG:  NSR no acute ischemic changes   Telemetry:  NSR no fib flutter 04/22/2013   Echo: EF normal mild MR  Medications:   . antiseptic oral rinse  15 mL Mouth Rinse BID  . digoxin  0.125 mg Oral Daily  . dronedarone  400 mg Oral BID WC  . feeding supplement (ENSURE COMPLETE)  237 mL Oral BID BM  . fluconazole (DIFLUCAN) IV  200 mg Intravenous Q24H  . pantoprazole (PROTONIX) IV  40 mg Intravenous Q12H  . piperacillin-tazobactam (ZOSYN)  IV  3.375 g Intravenous Q8H  . sodium chloride  3 mL Intravenous Q12H  . vancomycin (VANCOCIN)  750 mg IVPB  750 mg Intravenous Q48H     . sodium chloride 20 mL/hr at 04/21/13 0700    Assessment/Plan:  PAF:  NSR cannot tolerate iv amiodarone due to hallucinations although I suspect this has more to do with her overall medical condition Continue digoxin and multaq for now.  DVT prophylaxis Not sure what to make of elevated d dimer.   Pulm:  Good diuresis and normal EF  ARDS/leaky capillary type syndrome.  Continue diuresis  Continue antibiotics ? aspiration  Charlton Haws 04/22/2013, 7:51 AM

## 2013-04-22 NOTE — Care Management Note (Unsigned)
    Page 1 of 2   04/26/2013     4:14:19 PM   CARE MANAGEMENT NOTE 04/26/2013  Patient:  Breanna Guerrero, Breanna Guerrero   Account Number:  [id number]  Date Initiated:  04/20/2013  Documentation initiated by:  Carlyle Lipa  Subjective/Objective Assessment:   CA pt transferred from [facility] for AMS; resp issues, AF/RVR requiring Amio gtt     Action/Plan:   await medical stability to predict d/c location and needs   Anticipated DC Date:  04/25/2013   Anticipated DC Plan:  SKILLED NURSING FACILITY         Choice offered to / List presented to:             Status of service:  In process, will continue to follow Medicare Important Message given?   (If response is "NO", the following Medicare IM given date fields will be blank) Date Medicare IM given:   Date Additional Medicare IM given:    Discharge Disposition:    Per UR Regulation:  Reviewed for med. necessity/level of care/duration of stay  If discussed at Long Length of Stay Meetings, dates discussed:   04/22/2013  04/27/2013    Comments:  Contact:  Filter,Taft Spouse [phone]                  Reid,Lisa Daughter [phone]   04-26-13 1612 Tomi Bamberger, RN,BSN [phone] When medically stable pt will be d/c to SNF. CM did discuss with staff RN to speak to MD in reference to IV ABX. If pt will need longterm will need Picc vs change to po. Cm will continue to monitor.    04-22-13 11:50am Avie Arenas, RNBSN - [phone] lateral tx from 70M to 81M today - plan for tx out to acute/tele.   pateint has lived with husband and both work until recently when husband had ruptured appendix - and has been her since - hospital and rehab and now plans for discharge to [facility].  Daughter in room with mother would like same route.  Rehab here and then [place] with her dad.   SW already following.

## 2013-04-22 NOTE — Progress Notes (Signed)
Speech Language Pathology Treatment: Dysphagia  Patient Details Name: Breanna Guerrero MRN: [id number] DOB: [birth year] Today's Date: 04/22/2013 Time: 7829-5621 SLP Time Calculation (min): 41 min  Assessment / Plan / Recommendation Clinical Impression  Pt is unhappy with diet restrictions, says "you tricked me into that swallow test." SLP attempted to provide education regarding short term nature of restrictions (which in actuality are quite minimal). Pt consuming soft solids and nectar thick liquids without difficulty. SLP provided min verbal cues for pt to complete oral care after meal and for pt to take small sips and brief oral holding of thin liquids to reduce risk of aspiration. Pt verbalized understanding of precautions. She continue to need assist for set up with oral care, appropriate positioning prior to meals and reminders for safe swallow precautions. As respiratory function continues to improve, pt expected to resume thin liquids during meals.    HPI HPI: Pt found her down on the floor, confused, agitated, weak and tremulous, apparently having fallen from the couch. She had a similar episode of tremulousness, weakness and confusion about 4 days prior, this was when she was evaluated in the ED. Further history includes increased rectal drainage (she is s/p rectal cancer with diverting look ileostomy on January 07, 2013) along with increased output from the colostomy (4-5 times per day). The colostomy output today is loose and foul smelling. Her last antibiotics were during her admission for cancer at [facility]. Further, during the past week, she has had decreased PO intake to both solids and liquids due to feeling like she couldn't swallow, but no pain with swallowing. Her daughter also reports that she has had a personality change and has been less pleasant.  Pt suspected to have SIRS.    Pertinent Vitals NA  SLP Plan  Continue with current plan of care    Recommendations Diet  recommendations: Dysphagia 3 (mechanical soft);Thin liquid;Nectar-thick liquid Liquids provided via: Cup;Straw Medication Administration: Whole meds with puree Supervision: Patient able to self feed;Full supervision/cueing for compensatory strategies Compensations: Slow rate;Small sips/bites Postural Changes and/or Swallow Maneuvers: Seated upright 90 degrees;Out of bed for meals              Plan: Continue with current plan of care    GO    [facility], MA CCC-SLP [phone]  [name] 04/22/2013, 9:11 AM

## 2013-04-22 NOTE — Progress Notes (Signed)
PULMONARY  / CRITICAL CARE MEDICINE  Name: Breanna Guerrero MRN: [id number] DOB: 04/10/1928    ADMISSION DATE:  04/17/2013 CONSULTATION DATE:  04/18/13  REFERRING MD :  Danise Edge  PRIMARY SERVICE: IMTS   CHIEF COMPLAINT:  Hypotension   BRIEF PATIENT DESCRIPTION:  29 F with recent dx of rectal cancer s/p resection w/ ileostomy 01/2013 at [facility] . Chemo/XRT completed by Dr Truett Perna 10/2012. Transferred from outside hospital for hypovolemia and acute renal failure. B/P remained low despite fluid challenge. Moved to ICU and PCCM assumed care 11/16.  SIGNIFICANT EVENTS / STUDIES:  11/16 Transfer to ICU for persistent hypotension 11/16 Suspected transfusion reaction with dyspnea requiring NPPV 11/16 CT abd: post surgical changes, incompletely healed fracture of R sacrum and pubic symphysis  11/17 AFRVR > amiodarone 11/17 abdo US>>> 6 mm gallstone noted within the fundus of the gallbladder.<BR>Gallbladder wall is slightly thickened at 3.3 mm without, pericholecystic fluid or sonographic Murphy's sign 11/17 asymmetric LE edema > LE venous Dopplers:  11/17 Abn LFTs > RUQ [place]:  Prelim negative 11/17: Bipap required, distress 11/18: Swallow eval: recommended nectar thick liquids 11/19- still requires NIMV  LINES / TUBES: 11/15 Foley catheter >>> 11/18:Right IJ CVC>>>  CULTURES: 11/15 C Diff>> NEG 11/16 BC x2 >> pending 11/16 Urine >> No growth final Stool culture>> no predominant growth, pending final  ANTIBIOTICS: 11/16 Zosyn >> 11/18 vanc>>> 11/18 diflucan (empiric esophagitis)>>>  SUBJECTIVE:   Doing well, eating breakfast this morning  VITAL SIGNS: Temp:  [97.7 F (36.5 C)-99.9 F (37.7 C)] 98 F (36.7 C) (11/20 0805) Pulse Rate:  [70-144] 83 (11/20 0500) Resp:  [22-38] 29 (11/20 0500) BP: (94-128)/(52-79) 122/62 mmHg (11/20 0500) SpO2:  [95 %-100 %] 97 % (11/20 0500) FiO2 (%):  [40 %] 40 % (11/19 1117) HEMODYNAMICS: CVP:  [6 mmHg-9 mmHg] 6 mmHg VENTILATOR SETTINGS: Vent Mode:  [-]  BIPAP FiO2 (%):  [40 %] 40 % INTAKE / OUTPUT: Intake/Output     11/19 0701 - 11/20 0700 11/20 0701 - 11/21 0700   P.O. 520    I.V. (mL/kg) 493 (8.9)    IV Piggyback 450    Total Intake(mL/kg) 1463 (26.4)    Urine (mL/kg/hr) 2800 (2.1)    Stool 825 (0.6)    Total Output 3625     Net -2162            PHYSICAL EXAMINATION:  General:  Elderly female, anxious, alert Neuro:  Alert, following commands, moves extremities x 4  HEENT:  AT/[place], sclera clear Cardiovascular:  s1 s2 RRR no m/r/g  Lungs: Coarse throughout anteriorly Abdomen:  Soft,  Hyperactive BS +,  Mild generalized tenderness Musculoskeletal:  BLE edema, SCDs in place, 1+ bilateral DP pulses Skin:  Pale   LABS:  Recent Labs Lab 04/17/13 2107  04/19/13 0357 04/20/13 0956 04/20/13 1820 04/21/13 1800 04/22/13 0335  NA 126*  < > 133* 139 144 139 140  K 5.7*  < > 4.6 3.3* 3.8 2.5* 3.4*  CL 100  < > 107 103 111 101 104  CO2 13*  < > 12* 19 23 23 25   GLUCOSE 78  < > 140* 185* 76 212* 143*  BUN 56*  < > 54* 52* 9 43* 40*  CREATININE 2.22*  < > 2.05* 1.79* 0.81 1.50* 1.45*  CALCIUM 8.0*  < > 7.2* 7.8* 8.2* 7.8* 7.6*  MG 1.6  --   --   --   --   --   --   PHOS 3.6  --   --   --   --   --   --   < > =  values in this interval not displayed.  Recent Labs Lab 04/20/13 0956 04/21/13 1300 04/22/13 0335  HGB 8.7* 7.7* 7.0*  HCT 24.0* 20.9* 19.3*  WBC 12.1* 14.5* 12.4*  PLT 39*  46* 37* 42*     Recent Labs Lab 04/18/13 1230 04/19/13 0100  TROPONINI <0.30 <0.30    CXR 11/20: Worsened volume overload/edema  ASSESSMENT / PLAN:  PULMONARY A:  Tachypnea remains, regardless of correction of ph Corrected NON AG with bicarb, d/c'ed 11/18 Concern Asp PNA / silent aspiration supported by SLP P:   Cont supplemental O2 Cont scheduled NPPV x 24 hrs, try to increase time off BIPAP pCXR tomorrow CVP monitoring (7-9 over 24 hours)  CARDIOVASCULAR A:  Hypotension - suspect hypovolemic New onset PAF with RVR  (11/17) - sepsis related likely, now normal rate RLE swelling - neg LE dopplers Neg trop Echo with nl EF 55%, mild mitral regurg, mildly dilated left and right atria, trivial pericardial effusion posterior to heart P:  Amiodarone started 11/18, but d/c'd 2/2 hallucinations Per cards consult, added digoxin and multaq for PAF Central line placed 11/18 for CVP monitoring, lasix provided for CVP 12, now 7, lasix off.  RENAL A:   AKI, nonoliguric Mixed met acidosis, resolved with bicarb drip P:   May need further correction NON AG in future, not now as ostomy improved, bicarb gtt stopped Monitor ABG's as needed, avoid as able, DNI noted Monitor BMET intermittently Monitor ostomy output for need to restart bicarb, 825cc over 24 hours  GASTROINTESTINAL A: S/P resection of rectal Ca with ileostomy 8/14 High ostomy output Prot-cal malnutrition w/ low albumin and anorexia  R/o esophagitis, candidia Question reflux/distal esophogeal spasm per SLP Abd [place] 11/17 with thickened gallbladder, 6mm gallstone in fundus of gallbladder P:   SUP: IV PPI (indication - thrombocytopenia) BID Dysphagia III diet per speech GI consulted - rec eventual ba swallow or endo continue empiric diflucan Monitor for reflux  HEMATOLOGIC A:   Anemia without overt bleeding, FOBT neg Thrombocytopenia (sepsis) Likely dilutional component to plat as well P:  Recheck tomorrow Hgb goal >7, consider transfusion today Monitor for s/s active bleeding SCDs prophylaxis Treat sepsis  INFECTIOUS A:  Severe sepsis - unknown source  Markedly elevated PCT Leukocytosis with left shift- improving 11/19 Lactic acid downtrending P:   Micro and abx as above D/c vanco 11/20 empiric diflucan 11/18 > 11/21 Trend WBC Trend fever curve  ENDOCRINE A:   Mild hyperglycemia P:   Monitor glu on chem profiles Begin SSI for glu > 180  NEUROLOGIC A:  Encephalopathy, resolved Physical deconditioning P:   Support as  needed Minimize sedating medications PT/OT When able  Leona Singleton, MD 04/22/2013 8:52 AM   TODAY'S SUMMARY: Pt improving, Transfer to floor bed 11/20.   Levy Pupa, MD, PhD 04/22/2013, 9:21 AM [facility] [phone] or if no answer [phone]

## 2013-04-23 ENCOUNTER — Inpatient Hospital Stay (HOSPITAL_COMMUNITY): Payer: Medicare Other

## 2013-04-23 LAB — HEPARIN INDUCED THROMBOCYTOPENIA PNL
Heparin Induced Plt Ab: NEGATIVE
Patient O.D.: 0.067
UFH High Dose UFH H: 2 % Release
UFH Low Dose 0.1 IU/mL: 1 % Release
UFH SRA Result: NEGATIVE

## 2013-04-23 LAB — CBC
HCT: 20.9 % — ABNORMAL LOW (ref 36.0–46.0)
Hemoglobin: 7.5 g/dL — ABNORMAL LOW (ref 12.0–15.0)
MCH: 28 pg (ref 26.0–34.0)
MCHC: 35.9 g/dL (ref 30.0–36.0)
MCV: 78 fL (ref 78.0–100.0)
Platelets: 66 10*3/uL — ABNORMAL LOW (ref 150–400)
RBC: 2.68 MIL/uL — ABNORMAL LOW (ref 3.87–5.11)
RDW: 16.4 % — ABNORMAL HIGH (ref 11.5–15.5)
WBC: 12.2 10*3/uL — ABNORMAL HIGH (ref 4.0–10.5)

## 2013-04-23 LAB — BASIC METABOLIC PANEL
CO2: 26 mEq/L (ref 19–32)
Calcium: 7.9 mg/dL — ABNORMAL LOW (ref 8.4–10.5)
Chloride: 103 mEq/L (ref 96–112)
GFR calc Af Amer: 55 mL/min — ABNORMAL LOW (ref >90)
Glucose, Bld: 144 mg/dL — ABNORMAL HIGH (ref 70–99)
Potassium: 2.8 mEq/L — ABNORMAL LOW (ref 3.5–5.1)

## 2013-04-23 LAB — STOOL CULTURE

## 2013-04-23 LAB — PROCALCITONIN: Procalcitonin: 5.43 ng/mL

## 2013-04-23 MED ORDER — POTASSIUM CHLORIDE CRYS ER 20 MEQ PO TBCR
40.0000 meq | EXTENDED_RELEASE_TABLET | Freq: Once | ORAL | Status: AC
  Administered 2013-04-23: 40 meq via ORAL
  Filled 2013-04-23: qty 2

## 2013-04-23 MED ORDER — POTASSIUM CHLORIDE 20 MEQ/15ML (10%) PO LIQD
60.0000 meq | Freq: Once | ORAL | Status: DC
  Filled 2013-04-23: qty 45

## 2013-04-23 MED ORDER — POTASSIUM CHLORIDE 10 MEQ/50ML IV SOLN
10.0000 meq | INTRAVENOUS | Status: AC
  Administered 2013-04-23 (×2): 10 meq via INTRAVENOUS
  Filled 2013-04-23 (×2): qty 50

## 2013-04-23 NOTE — Progress Notes (Signed)
PULMONARY  / CRITICAL CARE MEDICINE  Name: Breanna Guerrero MRN: [id number] DOB: [birth year]    ADMISSION DATE:  04/17/2013 CONSULTATION DATE:  04/18/13  REFERRING MD :  Danise Edge  PRIMARY SERVICE: IMTS   CHIEF COMPLAINT:  Hypotension   BRIEF PATIENT DESCRIPTION:  5 F with recent dx of rectal cancer s/p resection w/ ileostomy 01/2013 at [facility] . Chemo/XRT completed by Dr Truett Perna 10/2012. Transferred from outside hospital for hypovolemia and acute renal failure. B/P remained low despite fluid challenge. Moved to ICU and PCCM assumed care 11/16.  SIGNIFICANT EVENTS / STUDIES:  11/16 Transfer to ICU for persistent hypotension 11/16 Suspected transfusion reaction with dyspnea requiring NPPV 11/16 CT abd: post surgical changes, incompletely healed fracture of R sacrum and pubic symphysis  11/17 AFRVR > amiodarone 11/17 abdo US>>> 6 mm gallstone noted within the fundus of the gallbladder.<BR>Gallbladder wall is slightly thickened at 3.3 mm without, pericholecystic fluid or sonographic Murphy's sign 11/17 asymmetric LE edema > LE venous Dopplers:  11/17 Abn LFTs > RUQ [place]:  Prelim negative 11/17: Bipap required, distress 11/18: Swallow eval: recommended nectar thick liquids 11/19- still requires NIMV 11/20- feeling better  LINES / TUBES: 11/15 Foley catheter >>> 11/18:Right IJ CVC>>>  CULTURES: 11/15 C Diff>> NEG 11/16 BC x2 >> pending 11/16 Urine >> No growth final Stool culture>> normal flora  ANTIBIOTICS: 11/16 Zosyn >> 11/18 vanc>>>11/21 11/18 diflucan (empiric esophagitis)>>>11/21  SUBJECTIVE:   Doing well, eating breakfast this morning  VITAL SIGNS: Temp:  [97.4 F (36.3 C)-97.9 F (36.6 C)] 97.9 F (36.6 C) (11/21 0444) Pulse Rate:  [78-83] 78 (11/21 1010) Resp:  [18-20] 18 (11/21 0444) BP: (124-142)/(74-83) 142/83 mmHg (11/21 0444) SpO2:  [93 %-99 %] 96 % (11/21 0444) HEMODYNAMICS:   VENTILATOR SETTINGS:   INTAKE / OUTPUT: Intake/Output     11/20 0701 - 11/21 0700 11/21  0701 - 11/22 0700   P.O. 720 10   I.V. (mL/kg) 60 (1.1)    IV Piggyback 112.5    Total Intake(mL/kg) 892.5 (16.1) 10 (0.2)   Urine (mL/kg/hr) 1601 (1.2) 200 (0.4)   Stool 102 (0.1) 1 (0)   Total Output 1703 201   Net -810.5 -191        Urine Occurrence 1 x      PHYSICAL EXAMINATION:  General:  Elderly female, anxious, alert Neuro:  Awake and alert, no distress PULM: Crackles bases CV: Irreg irreg, no mgr AB: bs+, soft, nontender Ext; warm, trace ankle edema Neuro: A&Ox4, maew  LABS:  Recent Labs Lab 04/17/13 2107  04/20/13 0956 04/20/13 1820 04/21/13 1800 04/22/13 0335 04/23/13 0522  NA 126*  < > 139 144 139 140 139  K 5.7*  < > 3.3* 3.8 2.5* 3.4* 2.8*  CL 100  < > 103 111 101 104 103  CO2 13*  < > 19 23 23 25 26   GLUCOSE 78  < > 185* 76 212* 143* 144*  BUN 56*  < > 52* 9 43* 40* 25*  CREATININE 2.22*  < > 1.79* 0.81 1.50* 1.45* 1.06  CALCIUM 8.0*  < > 7.8* 8.2* 7.8* 7.6* 7.9*  MG 1.6  --   --   --   --   --   --   PHOS 3.6  --   --   --   --   --   --   < > = values in this interval not displayed.  Recent Labs Lab 04/21/13 1300 04/22/13 0335 04/23/13 0522  HGB 7.7* 7.0*  7.5*  HCT 20.9* 19.3* 20.9*  WBC 14.5* 12.4* 12.2*  PLT 37* 42* 66*      Recent Labs Lab 04/18/13 1230 04/19/13 0100  TROPONINI <0.30 <0.30    CXR 11/20: Worsened volume overload/edema  ASSESSMENT / PLAN:  PULMONARY A:  Dyspnea improved Concern Asp PNA / silent aspiration supported by SLP P:   Cont supplemental O2 OOB IS Aspiration precautions  CARDIOVASCULAR A:  Hypotension - suspect hypovolemic > resolved New onset PAF with RVR (11/17) - sepsis related likely, now normal rate Amiodarone started 11/18, but d/c'd 2/2 hallucinations RLE swelling - neg LE dopplers Echo with nl EF 55%, mild mitral regurg, mildly dilated left and right atria, trivial pericardial effusion posterior to heart P:  D/C CVL Dig per cardiology   RENAL A:   AKI, nonoliguric > continues to  resolve Mixed met acidosis, resolved with bicarb drip Hypokalemia P:   Replete K Monitor BMET intermittently Monitor ostomy output for need to restart bicarb, 825cc over 24 hours  GASTROINTESTINAL A: S/P resection of rectal Ca with ileostomy 8/14 High ostomy output Prot-cal malnutrition w/ low albumin and anorexia  ? esophagitis, candidia Question reflux/distal esophogeal spasm per SLP Abd [place] 11/17 with thickened gallbladder, 6mm gallstone in fundus of gallbladder P:   SUP: IV PPI (indication - thrombocytopenia) BID Dysphagia III diet per speech GI consulted - rec eventual ba swallow or endo continue empiric diflucan Monitor for reflux  HEMATOLOGIC A:   Anemia without overt bleeding, FOBT neg Thrombocytopenia (sepsis)  P:  Recheck tomorrow Hgb goal >7 Monitor for s/s active bleeding SCDs prophylaxis  INFECTIOUS A:  Severe sepsis - never identified a clear source > resolved Lactic acid downtrending P:   Micro and abx as above D/c vanco 11/20 empiric diflucan 11/18 > 11/21 Consider d/c zosyn 11/23 or 11/24  ENDOCRINE A:   Mild hyperglycemia P:   Monitor glu on chem profiles Begin SSI for glu > 180  NEUROLOGIC A:  Encephalopathy, resolved Physical deconditioning P:   Support as needed Minimize sedating medications PT/OT  Lupita Leash, MD 04/23/2013 3:28 PM   TODAY'S SUMMARY: Pt improving, Transfer to IMTS.   Yolonda Kida PCCM Pager: [phone] Cell: [phone] If no response, call [phone]

## 2013-04-23 NOTE — Progress Notes (Addendum)
Speech Language Pathology Treatment: Dysphagia  Patient Details Name: Breanna Guerrero MRN: [id number] DOB: 1927/11/24 Today's Date: 04/23/2013 Time: 4098-1191 SLP Time Calculation (min): 18 min  Assessment / Plan / Recommendation Clinical Impression  Pt. Seen for skilled treatment of dysphagia with pt. in chair and normal appearing work of breathing at rest.  Continues to voice dislike of thick liquids, "I just can't drink them during meals" (RN reports she is refusing thick liquids).  SLP reiterated need for and clinical reasoning for thick liquid recommendation, recommendation for thin liquid in between meals following oral care.  Repeat CXR today revealed no significant change in bilateral upper lobe and infrahilar nodular and streaky pulmonary opacity, new since 04/17/2013. Superimposed bilateral pleural effusions. Favor bilateral pneumonia.  Pt. Cleaned oral cavity with Biotene toothbrush/sponge and consumed sips thin whole milk.  Min-mod reminders given to hold liquid in oral cavity several seconds to control bolus prior to swallow, small sips and drink/eat only when not SOB or increased work of breathing.  Mild throat clear x 1 demonstrated.  Reviewed recommendations with RN.  If she continues to exhibit stable respiratory status, may recommend return to thin liquids.   HPI HPI: Pt found her down on the floor, confused, agitated, weak and tremulous, apparently having fallen from the couch. She had a similar episode of tremulousness, weakness and confusion about 4 days prior, this was when she was evaluated in the ED. Further history includes increased rectal drainage (she is s/p rectal cancer with diverting look ileostomy on January 07, 2013) along with increased output from the colostomy (4-5 times per day). The colostomy output today is loose and foul smelling. Her last antibiotics were during her admission for cancer at [facility]. Further, during the past week, she has had decreased PO intake  to both solids and liquids due to feeling like she couldn't swallow, but no pain with swallowing. Her daughter also reports that she has had a personality change and has been less pleasant.  Pt suspected to have SIRS. MBS 04/16/13 recommended conservative diet of Dys 3 (mechanical soft) nectar thick liquids. Pt may have sips of thin water or tea in between meals following oral care when respiratory function WNL. Anticipate that as respiratory function becomes consistent pt can safely upgrade to regular diet with thin liquids.    Pertinent Vitals Appeared WFL's  SLP Plan  Continue with current plan of care (rec repeat MBS 1st next week)    Recommendations Diet recommendations: Dysphagia 3 (mechanical soft);Nectar-thick liquid (thin in between meals after oral care) Liquids provided via: Cup;No straw Medication Administration: Whole meds with puree Supervision: Patient able to self feed;Full supervision/cueing for compensatory strategies Compensations: Slow rate;Small sips/bites;Clear throat intermittently (sip liquid following every 2-3 bites) Postural Changes and/or Swallow Maneuvers: Seated upright 90 degrees;Upright 30-60 min after meal              Oral Care Recommendations: Oral care before and after PO Follow up Recommendations: None Plan: Continue with current plan of care (rec repeat MBS 1st next week)    GO     Darrow Bussing.Ed [employer] [phone]  04/23/2013

## 2013-04-23 NOTE — Progress Notes (Signed)
Order to start PIV and D/C central line. Pt refuses at this time, states she wants to "wait one more day" because she is a "hard stick." Pt verbalizes understanding of risks/benefits of CVC vs PIV vs PICC. RN at bedside.

## 2013-04-23 NOTE — Care Management (Signed)
1359 04-23-13 CM did call Daughter [name] in reference to wanting a screen for CIR. Daughter had questions in reference to insurance. CM did provide daughter with [facility] [employer] Number. As of now the plan will still be for SNF when pt is medically stable for d/c.  Gala Lewandowsky, RN,BSN [phone]

## 2013-04-23 NOTE — Progress Notes (Signed)
Physical Therapy Treatment Patient Details Name: Breanna Guerrero MRN: [id number] DOB: April 23, 1928 Today's Date: 04/23/2013 Time: 4098-1191 PT Time Calculation (min): 24 min  PT Assessment / Plan / Recommendation  History of Present Illness Pt is a 79 y.o. female adm secondary to fall and dehydration. Found to have 2 remote fractures by CT 04/18/2013 ( Incompletely healed fractures of the right side of the symphysis pubis and right sacral ala)   PT Comments   Patient is tentative with movement and is afraid to fall when upright and walking.  Patient still has functional weakness and deficits present with all functional tasks.  Patient will benefit from skilled acute care PT intervention as she prepares for discharge with continued rehab.   Follow Up Recommendations  CIR (Family strongly prefers this option and request screen)           Equipment Recommendations  Rolling walker with 5" wheels    Recommendations for Other Services Rehab consult  Frequency Min 3X/week   Progress towards PT Goals Progress towards PT goals: Progressing toward goals  Plan Discharge plan needs to be updated    Precautions / Restrictions Precautions Precautions: Fall Restrictions Weight Bearing Restrictions: No   Pertinent Vitals/Pain SaO2 92% at rest at beginning of session with 6L o2 via [place]    Mobility  Bed Mobility Bed Mobility: Supine to Sit Supine to Sit: 4: Min assist Sitting - Scoot to Edge of Bed: 3: Mod assist Details for Bed Mobility Assistance: Able to move legs to EOB, requires A for trunk support to achieve upright sitting Transfers Transfers: Sit to Stand Sit to Stand: 2: Max assist Stand to Sit: 3: Mod assist Details for Transfer Assistance: v/c for hand placement; Pt is fearful of falling and walking in general due to inactivity Ambulation/Gait Ambulation/Gait Assistance: 3: Mod assist Ambulation Distance (Feet): 25 Feet Assistive device: Rolling walker, 6 LO2 Ambulation/Gait  Assistance Details: Needs to be reassured that it will be safe with our assistance, forward stooped posture. 2 person A, one for lines and guarding, one for bring chair behind.  This benefits the patient bc of her fear of falling Gait Pattern: Step-through pattern Gait velocity: slow Stairs: No      PT Goals (current goals can now be found in the care plan section)    Visit Information  Last PT Received On: 04/23/13 Assistance Needed: +2 (for line management and chair) History of Present Illness: Pt is a 79 y.o. female adm secondary to fall and dehydration. Found to have 2 remote fractures by CT 04/18/2013 ( Incompletely healed fractures of the right side of the symphysis pubis and right sacral ala)    Subjective Data  Subjective: i'll try to walk   Cognition  Cognition Arousal/Alertness: Awake/alert Behavior During Therapy: WFL for tasks assessed/performed Overall Cognitive Status: Within Functional Limits for tasks assessed    Balance     End of Session PT - End of Session Equipment Utilized During Treatment: Gait belt;Oxygen;Other (comment) Activity Tolerance: Patient limited by fatigue;Patient limited by pain Patient left: in chair;with call bell/phone within reach;with family/visitor present Nurse Communication: Mobility status   GP    Barrie Dunker, SPT Pager:  [phone]  Barrie Dunker 04/23/2013, 11:33 AM

## 2013-04-23 NOTE — Progress Notes (Signed)
Patient ID: Breanna Guerrero, female   DOB: 04/16/1928, 79 y.o.   MRN: [id number]    Subjective:  Much improved. No dyspnea taking PO better  Objective:  Filed Vitals:   04/22/13 2007 04/22/13 2348 04/23/13 0412 04/23/13 0444  BP: 128/77 124/74  142/83  Pulse: 79 82  83  Temp: 97.4 F (36.3 C) 97.8 F (36.6 C)  97.9 F (36.6 C)  TempSrc: Oral   Oral  Resp: 20 18  18   Height:      Weight:      SpO2: 99% 97% 93% 96%    Intake/Output from previous day:  Intake/Output Summary (Last 24 hours) at 04/23/13 0856 Last data filed at 04/23/13 4098  Gross per 24 hour  Intake    740 ml  Output   1403 ml  Net   -663 ml    Physical Exam: Frail thin female Crackles and rales bilaterally Normal heart sounds Ileostomy Plus 2 PT No edema Neuro non focal  Lab Results: Basic Metabolic Panel:  Recent Labs  11/91/47 1800 04/22/13 0335  NA 139 140  K 2.5* 3.4*  CL 101 104  CO2 23 25  GLUCOSE 212* 143*  BUN 43* 40*  CREATININE 1.50* 1.45*  CALCIUM 7.8* 7.6*   Liver Function Tests:  Recent Labs  04/20/13 0956  AST 36  ALT 38*  ALKPHOS 352*  BILITOT 3.7*  PROT 5.4*  ALBUMIN 1.8*   CBC:  Recent Labs  04/20/13 0956 04/21/13 1300 04/22/13 0335 04/23/13 0522  WBC 12.1* 14.5* 12.4* 12.2*  NEUTROABS 11.5* 13.4*  --   --   HGB 8.7* 7.7* 7.0* 7.5*  HCT 24.0* 20.9* 19.3* 20.9*  MCV 75.0* 76.0* 75.4* 78.0  PLT 39*  46* 37* 42* 66*   D-Dimer:  Recent Labs  04/20/13 0956  DDIMER 8.20*    Imaging:  Cardiac Studies:  ECG:  NSR no acute ischemic changes   Telemetry:  NSR no fib flutter 04/23/2013   Echo: EF normal mild MR  Medications:   . antiseptic oral rinse  15 mL Mouth Rinse BID  . digoxin  0.125 mg Oral Daily  . dronedarone  400 mg Oral BID WC  . feeding supplement (ENSURE COMPLETE)  237 mL Oral BID BM  . fluconazole (DIFLUCAN) IV  200 mg Intravenous Q24H  . pantoprazole (PROTONIX) IV  40 mg Intravenous Q12H  . piperacillin-tazobactam (ZOSYN)  IV   3.375 g Intravenous Q8H  . sodium chloride  3 mL Intravenous Q12H     . sodium chloride 20 mL/hr at 04/21/13 0700    Assessment/Plan:  PAF:  NSR cannot tolerate iv amiodarone due to hallucinations although I suspect this has more to do with her overall medical condition Continue digoxin and multaq for now.  DVT prophylaxis Not sure what to make of elevated d dimer.   Pulm:  Good diuresis and normal EF  ARDS/leaky capillary type syndrome.  Elevated BUN and Cr  Lasix stopped yesterday follow I/O's  She will need inpatient rehab.  Tentative date for repeat surgery to reverse iliostomy is 06/10/13 at [facility]. Needs PT/OT , weight gain and better Albumin before surgery.  Continue multaq and digoxin until end of January.  Check digoxin level before d/c  Will check ECG over weekend  Discussed care with daughter  ? Triple lumen removal soon  Charlton Haws 04/23/2013, 8:56 AM

## 2013-04-23 NOTE — Progress Notes (Signed)
Rehab Admissions Coordinator Note:  Patient was screened by Clois Dupes for appropriateness for an Inpatient Acute Rehab Consult.  At this time, we are recommending Skilled Nursing Facility with her husband who was discharged to [facility] today from inpt rehab. [employer] medicare will not approve admission for this diagnosis.. I have discussed with RN CM.  Clois Dupes 04/23/2013, 1:11 PM  I can be reached at [phone].

## 2013-04-23 NOTE — Progress Notes (Addendum)
Subjective:  Breanna Guerrero is a 79 year old with past medical history HTN and rectal adenocarcinoma s/p neoadjuvant chemotherapy, radiation, and low anterior resection and diverting loop ileostomy on 01/07/13 who presented on 11/15 as transfer from [facility] with weakness, tremulousness,  confusion, and dysphagia. She was found to have severe sepsis (bandemia 30%, WBC 22K, acute renal failure) without clear source of infection and anemia requiring blood transfusion.  Due to hypotension unresponsive to fluid challenge she was moved to ICU and PCCM assumed care on 11/16.   Pt received IV fluids to maintain blood pressure and required Bipap  after respiratory distress. She also had possible  blood transfusion reaction (possible TRALI) with fluid overload.  On 11/17 she was found to be in AF with RVR most lilely due to severe sepsis requiring amidarone that was later discontinued secondary due to hallucinations. Pt then placed on digoxin and multaq (to continue to end of January with digoxin level at discharge).  Infectious work-up to date negative (c diff, strep pneum, legionella, blood, urine, stool, wet prep). Pt was started on zosyn (11/16), vancomycin (11/18-11/20), and diflucan (11/18-11/21). Pt with improving leukocytosis since admission. Pt with evidence of schistocyte on smear (11/18), increased LDH, and DIC panel unremarkable.   Pt with asymmetic LE edema with negative Doppler US. Pt had central line placed on 11/8 and received short course of diuretic therapy due to elevated CVP. 2-D echo normal with EF 55%, mild mitral regurg, mildly dilated left and right atria, and trivial pericardial effusion posterior to heart.    Pt with high ostomy output on admission with concern for possible esophagitis due to dysphagia.  On 11/17 abdo US revealed 6 mm gallstone within the fundus of the gallbladder with gallbladder wall  thickening without, pericholecystic fluid or sonographic Murphy's sign.  GI was  consulted and recommended PPI and eventual barium swallow study or endoscopy as outpatient.   Pt with improving renal function after bicarbonate drip. Still with thrombocytopenia and microcytic anemia. HIT Ab was negative.    Pt to have reverse iliostomy is 06/10/13 at [facility] and will need PT/OT and weight gain. Plan for SNF.  Pt seen and examined in PM on 11/21. She reports feeling weak with a dry mouth with no other complaints.     Objective: Vital signs in last 24 hours: Filed Vitals:   04/22/13 2348 04/23/13 0412 04/23/13 0444 04/23/13 1010  BP: 124/74  142/83   Pulse: 82  83 78  Temp: 97.8 F (36.6 C)  97.9 F (36.6 C)   TempSrc:   Oral   Resp: 18  18   Height:      Weight:      SpO2: 97% 93% 96%    Weight change:   Intake/Output Summary (Last 24 hours) at 04/23/13 1958 Last data filed at 04/23/13 1700  Gross per 24 hour  Intake    130 ml  Output   2701 ml  Net  -2571 ml   General: Pale and thin appearing   Neuro: Awake and alert, no distress  Lungs: Decreased breath sounds at bases and RUL fields    Heart: Irreg irreg rhythm , normal rate, no murmur  AB: bs+, soft, nontender, colostomy bag in place  Ext: +1  LE edema    Lab Results: Basic Metabolic Panel:  Recent Labs Lab 04/17/13 2107  04/22/13 0335 04/23/13 0522  NA 126*  < > 140 139  K 5.7*  < > 3.4* 2.8*  CL 100  < >  104 103  CO2 13*  < > 25 26  GLUCOSE 78  < > 143* 144*  BUN 56*  < > 40* 25*  CREATININE 2.22*  < > 1.45* 1.06  CALCIUM 8.0*  < > 7.6* 7.9*  MG 1.6  --   --   --   PHOS 3.6  --   --   --   < > = values in this interval not displayed. Liver Function Tests:  Recent Labs Lab 04/18/13 1410 04/20/13 0956  AST 38* 36  ALT 29 38*  ALKPHOS 198* 352*  BILITOT 2.4* 3.7*  PROT 4.9* 5.4*  ALBUMIN 1.8* 1.8*   No results found for this basename: LIPASE, AMYLASE,  in the last 168 hours No results found for this basename: AMMONIA,  in the last 168 hours CBC:  Recent Labs Lab  04/20/13 0956 04/21/13 1300 04/22/13 0335 04/23/13 0522  WBC 12.1* 14.5* 12.4* 12.2*  NEUTROABS 11.5* 13.4*  --   --   HGB 8.7* 7.7* 7.0* 7.5*  HCT 24.0* 20.9* 19.3* 20.9*  MCV 75.0* 76.0* 75.4* 78.0  PLT 39*  46* 37* 42* 66*   Cardiac Enzymes:  Recent Labs Lab 04/18/13 1230 04/19/13 0100  TROPONINI <0.30 <0.30   BNP: No results found for this basename: PROBNP,  in the last 168 hours D-Dimer:  Recent Labs Lab 04/20/13 0956  DDIMER 8.20*   CBG:  Recent Labs Lab 04/22/13 0116  GLUCAP 140*  140*   Hemoglobin A1C: No results found for this basename: HGBA1C,  in the last 168 hours Fasting Lipid Panel: No results found for this basename: CHOL, HDL, LDLCALC, TRIG, CHOLHDL, LDLDIRECT,  in the last 168 hours Thyroid Function Tests:  Recent Labs Lab 04/17/13 2107  TSH 2.784   Coagulation:  Recent Labs Lab 04/20/13 0956  LABPROT 15.3*  INR 1.24   Anemia Panel: No results found for this basename: VITAMINB12, FOLATE, FERRITIN, TIBC, IRON, RETICCTPCT,  in the last 168 hours Urine Drug Screen: Drugs of Abuse  No results found for this basename: labopia, cocainscrnur, labbenz, amphetmu, thcu, labbarb    Alcohol Level: No results found for this basename: ETH,  in the last 168 hours Urinalysis:  Recent Labs Lab 04/18/13 2312  COLORURINE YELLOW  LABSPEC 1.017  PHURINE 5.0  GLUCOSEU NEGATIVE  HGBUR SMALL*  BILIRUBINUR SMALL*  KETONESUR NEGATIVE  PROTEINUR 100*  UROBILINOGEN 0.2  NITRITE NEGATIVE  LEUKOCYTESUR TRACE*     Micro Results: Recent Results (from the past 240 hour(s))  CLOSTRIDIUM DIFFICILE BY PCR     Status: None   Collection Time    04/17/13  9:49 PM      Result Value Range Status   C difficile by pcr NEGATIVE  NEGATIVE Final  CULTURE, BLOOD (ROUTINE X 2)     Status: None   Collection Time    04/18/13  9:25 AM      Result Value Range Status   Specimen Description BLOOD RIGHT ARM   Final   Special Requests BOTTLES DRAWN AEROBIC  ONLY 10CC   Final   Culture  Setup Time     Final   Value: 04/18/2013 17:55     Performed at [facility]   Culture     Final   Value:        BLOOD CULTURE RECEIVED NO GROWTH TO DATE CULTURE WILL BE HELD FOR 5 DAYS BEFORE ISSUING A FINAL NEGATIVE REPORT     Performed at [facility]   Report  Status PENDING   Incomplete  CULTURE, BLOOD (ROUTINE X 2)     Status: None   Collection Time    04/18/13 11:07 AM      Result Value Range Status   Specimen Description BLOOD RIGHT ARM   Final   Special Requests BOTTLES DRAWN AEROBIC ONLY 10CC   Final   Culture  Setup Time     Final   Value: 04/18/2013 17:58     Performed at [facility]   Culture     Final   Value:        BLOOD CULTURE RECEIVED NO GROWTH TO DATE CULTURE WILL BE HELD FOR 5 DAYS BEFORE ISSUING A FINAL NEGATIVE REPORT     Performed at [facility]   Report Status PENDING   Incomplete  URINE CULTURE     Status: None   Collection Time    04/18/13 11:12 PM      Result Value Range Status   Specimen Description URINE, RANDOM   Final   Special Requests NONE   Final   Culture  Setup Time     Final   Value: 04/19/2013 09:44     Performed at [facility]   Culture     Final   Value: NO GROWTH     Performed at [facility]   Report Status 04/20/2013 FINAL   Final  URINE CULTURE     Status: None   Collection Time    04/19/13  2:06 AM      Result Value Range Status   Specimen Description URINE, RANDOM   Final   Special Requests PATIENT ON FOLLOWING ZOSYN   Final   Culture  Setup Time     Final   Value: 04/19/2013 09:44     Performed at [facility]   Culture     Final   Value: NO GROWTH     Performed at [facility]   Report Status 04/20/2013 FINAL   Final  MRSA PCR SCREENING     Status: None   Collection Time    04/19/13  2:40 AM      Result Value Range Status   MRSA by PCR NEGATIVE  NEGATIVE Final   Comment:            The GeneXpert MRSA Assay (FDA      approved for NASAL specimens     only), is one component of a     comprehensive MRSA colonization     surveillance program. It is not     intended to diagnose MRSA     infection nor to guide or     monitor treatment for     MRSA infections.  STOOL CULTURE     Status: None   Collection Time    04/19/13  8:36 AM      Result Value Range Status   Specimen Description STOOL   Final   Special Requests NONE   Final   Culture     Final   Value: NO SALMONELLA, SHIGELLA, CAMPYLOBACTER, YERSINIA, OR E.COLI 0157:H7 ISOLATED     Note: REDUCED NORMAL FLORA PRESENT     Performed at [facility]   Report Status 04/23/2013 FINAL   Final  WET PREP, GENITAL     Status: Abnormal   Collection Time    04/19/13  1:28 PM      Result Value Range Status   Yeast Wet Prep HPF POC NONE SEEN  NONE  SEEN Final   Trich, Wet Prep NONE SEEN  NONE SEEN Final   Clue Cells Wet Prep HPF POC NONE SEEN  NONE SEEN Final   WBC, Wet Prep HPF POC MANY (*) NONE SEEN Final   Studies/Results: Dg Chest Port 1 View  04/23/2013   CLINICAL DATA:  79 year old female with acute respiratory failure. Initial encounter. Pneumonia versus ARDS.  EXAM: PORTABLE CHEST - 1 VIEW  COMPARISON:  04/2013 and earlier.  FINDINGS: Continued confluent bilateral upper lobe and infrahilar nodular and interstitial pulmonary opacity, progressed since 04/1913 and new since 04/17/2013. Stable right IJ central line. No pneumothorax. Small bilateral pleural effusions. Stable cardiac size and mediastinal contours.  IMPRESSION: No significant change in bilateral upper lobe and infrahilar nodular and streaky pulmonary opacity, new since 04/17/2013. Superimposed bilateral pleural effusions. Favor bilateral pneumonia.   Electronically Signed   By: Augusto Gamble M.D.   On: 04/23/2013 07:52   Dg Chest Port 1 View  04/22/2013   CLINICAL DATA:  Short of breath  EXAM: PORTABLE CHEST - 1 VIEW  COMPARISON:  Yesterday  FINDINGS: Mild cardiomegaly. Diffuse edema is  worse. Bilateral effusions are worse. No pneumothorax. Stable central venous catheter.  IMPRESSION: Worsening volume overload.   Electronically Signed   By: Maryclare Bean M.D.   On: 04/22/2013 07:33   Medications: I have reviewed the patient's current medications. Scheduled Meds: . antiseptic oral rinse  15 mL Mouth Rinse BID  . digoxin  0.125 mg Oral Daily  . dronedarone  400 mg Oral BID WC  . feeding supplement (ENSURE COMPLETE)  237 mL Oral BID BM  . pantoprazole (PROTONIX) IV  40 mg Intravenous Q12H  . piperacillin-tazobactam (ZOSYN)  IV  3.375 g Intravenous Q8H  . potassium chloride  60 mEq Oral Once  . sodium chloride  3 mL Intravenous Q12H   Continuous Infusions: . sodium chloride 20 mL/hr at 04/21/13 0700   PRN Meds:.ondansetron (ZOFRAN) IV, RESOURCE THICKENUP CLEAR Assessment/Plan: Principal Problem:   Severe sepsis(995.92) Active Problems:   Volume depletion   Acute kidney injury   Tremulousness   Dysphagia   Anemia, chronic disease   Malnutrition of moderate degree   HCAP (healthcare-associated pneumonia)   Acute pulmonary edema   Acute respiratory failure   A-fib  Septic shock  - Most likely due to HCAP PNA. Pt with last CXR on 11/21 with possible bilateral PNA. Leukocytosis improving. Blood cultures NGTD and procalcitonin down trending.   -Consider CT chest or repeat CXR if respiratory status declines -Continue IV zosyn (day 6) and add back vancomycin  fro HCAP -Central line removal  -Plan to wean off oxygen supplementation , currently on 5L [place] - PT consult   Paroxysmal atrial fibrillation - currently in NRS. Per cards to continue meds to end of January with digoxin level at time of discharge. -Continue digoxin -Continue dronedarone -Continue on telemetry   Thrombocytopenia - currently 66K.  Possibly due to severe sepsis. Also concerning for TTP due to presence of schistocytes on blood smear,  renal failure & neurological changes on admission, and elevated LDH  with normal PT/PTT.  Pt with negative HIT Ab. DIC panel was negative. Currently without neurological symptoms and resolution of renal failure. -Contine to monitor CBC  -Monitor for bleeding  Microcytic Anemia - currently 7.5, on admission was 7.8 , received 1 PRBC however with transfusion reaction (possible TRALI).  Pt complaining of weakness and appears pale.   -Obtain anemia panel -Continue to monitor CBC -Transfuse to keep  Hg>7 --> pt had possible transfusion rxn with last transfusion  Hypokalemia - Currently 2.8, most likely due to malnutrition  -Daily PO KCl 40 mEq   -Obtain Mg level to keep >2  Hypoalbuminemia - Last albumin 1.8, most likely due to malnutrition -Nutrition consult -Dysphagia diet  -Ensure supplements  GERD- stable -Continue IV 40 mg Protonix daily   Dispo: Disposition is deferred at this time, awaiting improvement of current medical problems.  Anticipated discharge in approximately 2-3 day(s).   The patient does have a current PCP (Rufus Effie Shy III, MD) and does need an [facility] hospital follow-up appointment after discharge.  The patient does not have transportation limitations that hinder transportation to clinic appointments.  .Services Needed at time of discharge: Y = Yes, Blank = No PT:   OT:   RN:   Equipment:   Other:     LOS: 6 days   Otis Brace, MD 04/23/2013, 7:58 PM

## 2013-04-23 NOTE — Progress Notes (Signed)
Read, reviewed, and agree.  Iyona Pehrson B. Lanny Lipkin, PT, DPT #[phone]  

## 2013-04-24 DIAGNOSIS — A419 Sepsis, unspecified organism: Principal | ICD-10-CM

## 2013-04-24 LAB — BASIC METABOLIC PANEL
BUN: 16 mg/dL (ref 6–23)
Chloride: 104 mEq/L (ref 96–112)
Chloride: 103 mEq/L (ref 96–112)
GFR calc Af Amer: 67 mL/min — ABNORMAL LOW (ref >90)
GFR calc Af Amer: 69 mL/min — ABNORMAL LOW (ref >90)
GFR calc non Af Amer: 58 mL/min — ABNORMAL LOW (ref >90)
GFR calc non Af Amer: 60 mL/min — ABNORMAL LOW (ref >90)
Potassium: 4.2 mEq/L (ref 3.5–5.1)
Potassium: 4.2 mEq/L (ref 3.5–5.1)
Sodium: 137 mEq/L (ref 135–145)
Sodium: 137 mEq/L (ref 135–145)

## 2013-04-24 LAB — CBC WITH DIFFERENTIAL/PLATELET
Basophils Absolute: 0 10*3/uL (ref 0.0–0.1)
Basophils Relative: 0 % (ref 0–1)
Hemoglobin: 6.3 g/dL — CL (ref 12.0–15.0)
MCHC: 35.4 g/dL (ref 30.0–36.0)
Monocytes Relative: 4 % (ref 3–12)
Neutro Abs: 6.6 10*3/uL (ref 1.7–7.7)
Neutrophils Relative %: 88 % — ABNORMAL HIGH (ref 43–77)
RBC: 2.24 MIL/uL — ABNORMAL LOW (ref 3.87–5.11)

## 2013-04-24 LAB — CBC
HCT: 19.6 % — ABNORMAL LOW (ref 36.0–46.0)
Hemoglobin: 6.9 g/dL — CL (ref 12.0–15.0)
RBC: 2.46 MIL/uL — ABNORMAL LOW (ref 3.87–5.11)
WBC: 8 10*3/uL (ref 4.0–10.5)

## 2013-04-24 LAB — PROCALCITONIN: Procalcitonin: 2.91 ng/mL

## 2013-04-24 MED ORDER — POTASSIUM CHLORIDE CRYS ER 20 MEQ PO TBCR
40.0000 meq | EXTENDED_RELEASE_TABLET | Freq: Every day | ORAL | Status: DC
  Administered 2013-04-24 – 2013-04-30 (×7): 40 meq via ORAL
  Filled 2013-04-24 (×8): qty 2

## 2013-04-24 MED ORDER — VANCOMYCIN HCL IN DEXTROSE 750-5 MG/150ML-% IV SOLN
750.0000 mg | INTRAVENOUS | Status: AC
  Administered 2013-04-24 – 2013-04-27 (×4): 750 mg via INTRAVENOUS
  Filled 2013-04-24 (×4): qty 150

## 2013-04-24 MED ORDER — SODIUM CHLORIDE 0.9 % IJ SOLN
10.0000 mL | INTRAMUSCULAR | Status: DC | PRN
  Administered 2013-04-24: 20 mL

## 2013-04-24 NOTE — Progress Notes (Signed)
CRITICAL VALUE ALERT  Critical value received: Hgb. 6.3  Date of notification:  04/24/13  Time of notification:  15:00  Critical value read back:yes  Nurse who received alert:  Lethea Killings  MD notified (1st page):  Dr. Herma Carson  Time of first page:  15:21  MD notified (2nd page):Dr. Shirlee Latch  Time of second page:17:15  Responding MD:  Dr. Shirlee Latch  Time MD responded:  17:20

## 2013-04-24 NOTE — Progress Notes (Signed)
ANTIBIOTIC CONSULT NOTE - INITIAL  Pharmacy Consult for vancomycin  Indication: possible HCAP  Allergies  Allergen Reactions  . Amiodarone     ? hallucinations  . Sulfa Antibiotics   . Latex Rash    Patient Measurements: Height: 5\' 2"  (157.5 cm) Weight: 122 lb 5.7 oz (55.5 kg) IBW/kg (Calculated) : 50.1  Vital Signs: Temp: 97.9 F (36.6 C) (11/22 0646) Temp src: Oral (11/22 0646) BP: 148/72 mmHg (11/22 0646) Pulse Rate: 84 (11/22 0933) Intake/Output from previous day: 11/21 0701 - 11/22 0700 In: 130 [P.O.:130] Out: 2401 [Urine:650; Stool:1751] Intake/Output from this shift: Total I/O In: 200 [P.O.:200] Out: -   Labs:  Recent Labs  04/21/13 1300 04/21/13 1800 04/22/13 0335 04/23/13 0522  WBC 14.5*  --  12.4* 12.2*  HGB 7.7*  --  7.0* 7.5*  PLT 37*  --  42* 66*  CREATININE  --  1.50* 1.45* 1.06   Estimated Creatinine Clearance: 31.8 ml/min (by C-G formula based on Cr of 1.06). No results found for this basename: VANCOTROUGH, VANCOPEAK, VANCORANDOM, GENTTROUGH, GENTPEAK, GENTRANDOM, TOBRATROUGH, TOBRAPEAK, TOBRARND, AMIKACINPEAK, AMIKACINTROU, AMIKACIN,  in the last 72 hours   Microbiology: Recent Results (from the past 720 hour(s))  CLOSTRIDIUM DIFFICILE BY PCR     Status: None   Collection Time    04/17/13  9:49 PM      Result Value Range Status   C difficile by pcr NEGATIVE  NEGATIVE Final  CULTURE, BLOOD (ROUTINE X 2)     Status: None   Collection Time    04/18/13  9:25 AM      Result Value Range Status   Specimen Description BLOOD RIGHT ARM   Final   Special Requests BOTTLES DRAWN AEROBIC ONLY 10CC   Final   Culture  Setup Time     Final   Value: 04/18/2013 17:55     Performed at [facility]   Culture     Final   Value:        BLOOD CULTURE RECEIVED NO GROWTH TO DATE CULTURE WILL BE HELD FOR 5 DAYS BEFORE ISSUING A FINAL NEGATIVE REPORT     Performed at [facility]   Report Status PENDING   Incomplete  CULTURE, BLOOD (ROUTINE X  2)     Status: None   Collection Time    04/18/13 11:07 AM      Result Value Range Status   Specimen Description BLOOD RIGHT ARM   Final   Special Requests BOTTLES DRAWN AEROBIC ONLY 10CC   Final   Culture  Setup Time     Final   Value: 04/18/2013 17:58     Performed at [facility]   Culture     Final   Value:        BLOOD CULTURE RECEIVED NO GROWTH TO DATE CULTURE WILL BE HELD FOR 5 DAYS BEFORE ISSUING A FINAL NEGATIVE REPORT     Performed at [facility]   Report Status PENDING   Incomplete  URINE CULTURE     Status: None   Collection Time    04/18/13 11:12 PM      Result Value Range Status   Specimen Description URINE, RANDOM   Final   Special Requests NONE   Final   Culture  Setup Time     Final   Value: 04/19/2013 09:44     Performed at [facility]   Culture     Final   Value: NO GROWTH  Performed at [facility]   Report Status 04/20/2013 FINAL   Final  URINE CULTURE     Status: None   Collection Time    04/19/13  2:06 AM      Result Value Range Status   Specimen Description URINE, RANDOM   Final   Special Requests PATIENT ON FOLLOWING ZOSYN   Final   Culture  Setup Time     Final   Value: 04/19/2013 09:44     Performed at [facility]   Culture     Final   Value: NO GROWTH     Performed at [facility]   Report Status 04/20/2013 FINAL   Final  MRSA PCR SCREENING     Status: None   Collection Time    04/19/13  2:40 AM      Result Value Range Status   MRSA by PCR NEGATIVE  NEGATIVE Final   Comment:            The GeneXpert MRSA Assay (FDA     approved for NASAL specimens     only), is one component of a     comprehensive MRSA colonization     surveillance program. It is not     intended to diagnose MRSA     infection nor to guide or     monitor treatment for     MRSA infections.  STOOL CULTURE     Status: None   Collection Time    04/19/13  8:36 AM      Result Value Range Status   Specimen Description  STOOL   Final   Special Requests NONE   Final   Culture     Final   Value: NO SALMONELLA, SHIGELLA, CAMPYLOBACTER, YERSINIA, OR E.COLI 0157:H7 ISOLATED     Note: REDUCED NORMAL FLORA PRESENT     Performed at [facility]   Report Status 04/23/2013 FINAL   Final  WET PREP, GENITAL     Status: Abnormal   Collection Time    04/19/13  1:28 PM      Result Value Range Status   Yeast Wet Prep HPF POC NONE SEEN  NONE SEEN Final   Trich, Wet Prep NONE SEEN  NONE SEEN Final   Clue Cells Wet Prep HPF POC NONE SEEN  NONE SEEN Final   WBC, Wet Prep HPF POC MANY (*) NONE SEEN Final    Medical History: Past Medical History  Diagnosis Date  . Hypertension   . Stroke May 2013    mild stroke  . Hemorrhoids   . Cancer     Rectal adenocarcinoma    Medications:  Scheduled:  . antiseptic oral rinse  15 mL Mouth Rinse BID  . digoxin  0.125 mg Oral Daily  . dronedarone  400 mg Oral BID WC  . feeding supplement (ENSURE COMPLETE)  237 mL Oral BID BM  . pantoprazole (PROTONIX) IV  40 mg Intravenous Q12H  . piperacillin-tazobactam (ZOSYN)  IV  3.375 g Intravenous Q8H  . potassium chloride  40 mEq Oral Daily  . sodium chloride  3 mL Intravenous Q12H  . vancomycin  750 mg Intravenous Q24H   Assessment: 18 yoF with admitted with altered mental status, recent dx of rectal cancer s/p resection w/ ileostomy 01/2013 at [facility]. Chemo/XRT completed 10/2012. Tx from outside hosp for hypovolemia, ARF, and hypotension. Pulmonary distress > possible ASP PNA, initially treated with vancomycin + zosyn and narrowed to Zosyn on 11/20 with minimal  improvement. Pharmacy consulted to restart vancomycin   11/15 C Diff: NEG 11/16 BCx: NGTD 11/16 and 11/17 Urine: neg 11/17 wet prep: many WBC 11/17 stool: reduced normal flora; GI pathogen PCR neg  Zosyn 11/16 >> Vanc 11/18>> 11/20 Vanc 11/22>> Diflucan 11/18>> 11/21 (tx completed for esophagitis)  Goal of Therapy:  Vancomycin trough level 15-20 mcg/ml  Plan:   Vancomycin 750mg  IV Q24h, will adjust dose as needed Continue Zosyn 3.375g IV Q8h Monitor clinical status, WBC, C&S  Thank you for allowing me to take part in the care of this patient,  Britt Bottom B. Artelia Laroche, PharmD Clinical Pharmacist - Resident Pager: [phone] Phone: [phone] 04/24/2013 10:42 AM

## 2013-04-24 NOTE — Progress Notes (Signed)
Patient Name: Breanna Guerrero      SUBJECTIVE: Admitted with a sepsis syndrome and possible aspiration and found to be in atrial fibrillation-recurrent. Apparently had hallucinations on IV amiodarone (??). With recurrent atrial flutter she was started on dronaderone and dig. She was notably hypokalemic yesterday at 2.8 with repletion ordeded  Echo demonstrated normal left ventricular function biatrial enlargement and a dilated IVC.  She has a history of rectal cancer and is status post radiation therapy resection diverting ileostomy and chemotherapy.  Past Medical History  Diagnosis Date  . Hypertension   . Stroke May 2013    mild stroke  . Hemorrhoids   . Cancer     Rectal adenocarcinoma    Scheduled Meds:  Scheduled Meds: . antiseptic oral rinse  15 mL Mouth Rinse BID  . digoxin  0.125 mg Oral Daily  . dronedarone  400 mg Oral BID WC  . feeding supplement (ENSURE COMPLETE)  237 mL Oral BID BM  . pantoprazole (PROTONIX) IV  40 mg Intravenous Q12H  . piperacillin-tazobactam (ZOSYN)  IV  3.375 g Intravenous Q8H  . potassium chloride  40 mEq Oral Daily  . sodium chloride  3 mL Intravenous Q12H  . vancomycin  750 mg Intravenous Q24H   Continuous Infusions: . sodium chloride 20 mL/hr at 04/21/13 0700    PHYSICAL EXAM Filed Vitals:   04/23/13 1010 04/23/13 2051 04/24/13 0646 04/24/13 0933  BP:  120/62 148/72   Pulse: 78 84 87 84  Temp:  98 F (36.7 C) 97.9 F (36.6 C)   TempSrc:  Oral Oral   Resp:  18 18   Height:      Weight:      SpO2:  98% 100%    Well developed and nourished in no acute distress     HENT normal Neck supple with JVP-flat Clear Regular rate and rhythm, no murmurs or gallops Abd-soft with active BS No Clubbing cyanosis edema Skin-warm and dry A & Oriented  Grossly normal sensory and motor function  TELEMETRY: Reviewed telemetry pt in NSR   Intake/Output Summary (Last 24 hours) at 04/24/13 1148 Last data filed at 04/24/13 0900  Gross per 24 hour  Intake    330 ml  Output   1601 ml  Net  -1271 ml    LABS: Basic Metabolic Panel:  Recent Labs Lab 04/17/13 2107  04/19/13 0100 04/19/13 0357 04/20/13 0956 04/20/13 1820 04/21/13 1800 04/22/13 0335 04/23/13 0522  NA 126*  < > 130* 133* 139 144 139 140 139  K 5.7*  < > 4.4 4.6 3.3* 3.8 2.5* 3.4* 2.8*  CL 100  < > 104 107 103 111 101 104 103  CO2 13*  < > 9* 12* 19 23 23 25 26   GLUCOSE 78  < > 128* 140* 185* 76 212* 143* 144*  BUN 56*  < > 55* 54* 52* 9 43* 40* 25*  CREATININE 2.22*  < > 2.01* 2.05* 1.79* 0.81 1.50* 1.45* 1.06  CALCIUM 8.0*  < > 7.5* 7.2* 7.8* 8.2* 7.8* 7.6* 7.9*  MG 1.6  --   --   --   --   --   --   --   --   PHOS 3.6  --   --   --   --   --   --   --   --   < > = values in this interval not displayed. Cardiac Enzymes: No results found for  this basename: CKTOTAL, CKMB, CKMBINDEX, TROPONINI,  in the last 72 hours CBC:  Recent Labs Lab 04/18/13 0503 04/18/13 1410 04/19/13 0100 04/19/13 0357 04/20/13 0956 04/21/13 1300 04/22/13 0335 04/23/13 0522  WBC 11.8* 20.8* 19.7* 17.8* 12.1* 14.5* 12.4* 12.2*  NEUTROABS 11.4*  --  17.7* 16.8* 11.5* 13.4*  --   --   HGB 7.8* 7.0* 8.0* 7.8* 8.7* 7.7* 7.0* 7.5*  HCT 21.5* 19.6* 21.8* 21.4* 24.0* 20.9* 19.3* 20.9*  MCV 77.3* 77.2* 76.8* 77.8* 75.0* 76.0* 75.4* 78.0  PLT 84* 76* 74* 57* 39*  46* 37* 42* 66*     ASSESSMENT AND PLAN: Atrial fibrillation  HFpEF  Renal insufficiency  Anemia  Sepsis syndrome  Hypokalemia  We will plan to discontinue her digoxin with normal left ventricular function the interaction with dronaderone via the P.-glycoprotein probably makes the drug too risky. She is also hypokalemic in the process of repletion. We'll remeasure potassium today.  Otherwise cotinue current meds  Signed, Sherryl Manges MD  04/24/2013

## 2013-04-24 NOTE — Progress Notes (Signed)
Pt. Refused to have central line d/c'd and a PIV started.  Pt. States she wants to "wait one more day" because she is a "difficult stick and already bruised enough."  IV nurse educated on risks and benefits of a central line vs PIV.  Pt stated she has had a" PICC before and would not mind that."

## 2013-04-24 NOTE — Progress Notes (Signed)
Internal Medicine Attending  Date: 04/24/2013  Patient name: Breanna Guerrero Medical record number: [id number] Date of birth: 01-25-1928 Age: 79 y.o. Gender: female  I saw and evaluated the patient. I reviewed the resident's note filed today by Dr. Johna Roles and I agree with the resident's findings and plans as documented in her progress note.  At this point it appears that she had a healthcare associated pneumonia complicated by septic shock.  She responded to Zosyn and Vancomycin and has been transferred out of the MICU.  We will continue the Zosyn and restart the Vancomycin.  We will also transfuse to a target Hgb of 7.0 and replete the potassium.  Appreciate Cardiology's input/advice.  Finally, we will continue physical therapy as we work towards disposition.

## 2013-04-25 ENCOUNTER — Encounter (HOSPITAL_COMMUNITY): Payer: Self-pay | Admitting: Internal Medicine

## 2013-04-25 LAB — IRON AND TIBC
Iron: 52 ug/dL (ref 42–135)
Saturation Ratios: 26 % (ref 20–55)
TIBC: 198 ug/dL — ABNORMAL LOW (ref 250–470)
UIBC: 146 ug/dL (ref 125–400)

## 2013-04-25 LAB — CBC WITH DIFFERENTIAL/PLATELET
Basophils Relative: 0 % (ref 0–1)
Eosinophils Absolute: 0.1 10*3/uL (ref 0.0–0.7)
Eosinophils Relative: 2 % (ref 0–5)
Hemoglobin: 7 g/dL — ABNORMAL LOW (ref 12.0–15.0)
Lymphs Abs: 0.6 10*3/uL — ABNORMAL LOW (ref 0.7–4.0)
MCH: 27.9 pg (ref 26.0–34.0)
MCV: 81.3 fL (ref 78.0–100.0)
Monocytes Absolute: 0.4 10*3/uL (ref 0.1–1.0)
Monocytes Relative: 5 % (ref 3–12)
Neutrophils Relative %: 84 % — ABNORMAL HIGH (ref 43–77)
RBC: 2.51 MIL/uL — ABNORMAL LOW (ref 3.87–5.11)

## 2013-04-25 LAB — BASIC METABOLIC PANEL
Calcium: 8.2 mg/dL — ABNORMAL LOW (ref 8.4–10.5)
Chloride: 106 mEq/L (ref 96–112)
GFR calc Af Amer: 67 mL/min — ABNORMAL LOW (ref >90)
GFR calc non Af Amer: 58 mL/min — ABNORMAL LOW (ref >90)
Potassium: 3.9 mEq/L (ref 3.5–5.1)
Sodium: 139 mEq/L (ref 135–145)

## 2013-04-25 LAB — MAGNESIUM: Magnesium: 1.5 mg/dL (ref 1.5–2.5)

## 2013-04-25 LAB — RETICULOCYTES: Retic Ct Pct: 2.9 % (ref 0.4–3.1)

## 2013-04-25 LAB — FERRITIN: Ferritin: 610 ng/mL — ABNORMAL HIGH (ref 10–291)

## 2013-04-25 LAB — VITAMIN B12: Vitamin B-12: 1822 pg/mL — ABNORMAL HIGH (ref 211–911)

## 2013-04-25 NOTE — Progress Notes (Signed)
Pt. Repeat hgb 6.9 from previous hgb 6.3.  MD notified.  No orders received at this time for transfusion.  Will continue to monitor patient.

## 2013-04-25 NOTE — Progress Notes (Signed)
Internal Medicine Attending  Date: 04/25/2013  Patient name: Breanna Guerrero Medical record number: [id number] Date of birth: June 20, 1927 Age: 79 y.o. Gender: female  I saw and evaluated the patient. I reviewed the resident's note by Dr. Andrey Campanile and I agree with the resident's findings and plans as documented in her progress note.  [name] is unhappy this morning with the multiple individuals who have come by to independently assess her.  She states she is tired of being poked and prodded.  Despite this, she states that her breathing is fine and she is w/o other acute complaints other than wanting more food.  I agree with the housestaff's plan to reassess her swallowing now that she is clinically improved.  We will continue to treat her healthcare associated pneumonia with Vancomycin and Zosyn.  We will also move forward with Physical Therapy to avoid the deconditioning that accompanies a prolong hospitalization, especially one with a detour to the MICU.

## 2013-04-25 NOTE — Progress Notes (Signed)
Subjective: Breanna Guerrero was seen and examined this morning.  She was eating her breakfast and family was present.  She reports good appetite and is requesting more food options.  She denies pain/SOB but feels weak.    Objective: Vital signs in last 24 hours: Filed Vitals:   04/24/13 0933 04/24/13 1400 04/24/13 2050 04/25/13 0513  BP:  122/57 137/62 130/55  Pulse: 84 77 95 83  Temp:  97.7 F (36.5 C) 97.6 F (36.4 C) 98.1 F (36.7 C)  TempSrc:  Oral Oral Oral  Resp:  16 18   Height:      Weight:      SpO2:  98% 98% 96%   Weight change:   Intake/Output Summary (Last 24 hours) at 04/25/13 0923 Last data filed at 04/24/13 2056  Gross per 24 hour  Intake    240 ml  Output    900 ml  Net   -660 ml   General: Pale and thin appearing, in NAD Neuro: Awake and alert, mentation is appropriate Lungs: Decreased breath sounds   Heart: NSR, normal rate, no murmur  AB: bs+, soft, nontender, colostomy bag with thin brown output Ext: +1  LE edema B/L  Lab Results: Basic Metabolic Panel:  Recent Labs Lab 04/24/13 1936 04/25/13 0520  NA 137 139  K 4.2 3.9  CL 103 106  CO2 23 27  GLUCOSE 201* 111*  BUN 16 14  CREATININE 0.87 0.90  CALCIUM 7.8* 8.2*  MG  --  1.5   Liver Function Tests:  Recent Labs Lab 04/18/13 1410 04/20/13 0956  AST 38* 36  ALT 29 38*  ALKPHOS 198* 352*  BILITOT 2.4* 3.7*  PROT 4.9* 5.4*  ALBUMIN 1.8* 1.8*   CBC:  Recent Labs Lab 04/24/13 1425 04/24/13 1936 04/25/13 0520  WBC 7.5 8.0 7.2  NEUTROABS 6.6  --  6.1  HGB 6.3* 6.9* 7.0*  HCT 17.8* 19.6* 20.4*  MCV 79.5 79.7 81.3  PLT 94* 109* 138*   Cardiac Enzymes:  Recent Labs Lab 04/18/13 1230 04/19/13 0100  TROPONINI <0.30 <0.30   D-Dimer:  Recent Labs Lab 04/20/13 0956  DDIMER 8.20*   CBG:  Recent Labs Lab 04/22/13 0116  GLUCAP 140*  140*   Coagulation:  Recent Labs Lab 04/20/13 0956  LABPROT 15.3*  INR 1.24   Anemia Panel:  Recent Labs Lab 04/25/13 0520    RETICCTPCT 2.9   Urinalysis:  Recent Labs Lab 04/18/13 2312  COLORURINE YELLOW  LABSPEC 1.017  PHURINE 5.0  GLUCOSEU NEGATIVE  HGBUR SMALL*  BILIRUBINUR SMALL*  KETONESUR NEGATIVE  PROTEINUR 100*  UROBILINOGEN 0.2  NITRITE NEGATIVE  LEUKOCYTESUR TRACE*    Micro Results: Recent Results (from the past 240 hour(s))  CLOSTRIDIUM DIFFICILE BY PCR     Status: None   Collection Time    04/17/13  9:49 PM      Result Value Range Status   C difficile by pcr NEGATIVE  NEGATIVE Final  CULTURE, BLOOD (ROUTINE X 2)     Status: None   Collection Time    04/18/13  9:25 AM      Result Value Range Status   Specimen Description BLOOD RIGHT ARM   Final   Special Requests BOTTLES DRAWN AEROBIC ONLY 10CC   Final   Culture  Setup Time     Final   Value: 04/18/2013 17:55     Performed at [facility]   Culture     Final   Value:  BLOOD CULTURE RECEIVED NO GROWTH TO DATE CULTURE WILL BE HELD FOR 5 DAYS BEFORE ISSUING A FINAL NEGATIVE REPORT     Performed at [facility]   Report Status PENDING   Incomplete  CULTURE, BLOOD (ROUTINE X 2)     Status: None   Collection Time    04/18/13 11:07 AM      Result Value Range Status   Specimen Description BLOOD RIGHT ARM   Final   Special Requests BOTTLES DRAWN AEROBIC ONLY 10CC   Final   Culture  Setup Time     Final   Value: 04/18/2013 17:58     Performed at [facility]   Culture     Final   Value:        BLOOD CULTURE RECEIVED NO GROWTH TO DATE CULTURE WILL BE HELD FOR 5 DAYS BEFORE ISSUING A FINAL NEGATIVE REPORT     Performed at [facility]   Report Status PENDING   Incomplete  URINE CULTURE     Status: None   Collection Time    04/18/13 11:12 PM      Result Value Range Status   Specimen Description URINE, RANDOM   Final   Special Requests NONE   Final   Culture  Setup Time     Final   Value: 04/19/2013 09:44     Performed at [facility]   Culture     Final   Value: NO GROWTH      Performed at [facility]   Report Status 04/20/2013 FINAL   Final  URINE CULTURE     Status: None   Collection Time    04/19/13  2:06 AM      Result Value Range Status   Specimen Description URINE, RANDOM   Final   Special Requests PATIENT ON FOLLOWING ZOSYN   Final   Culture  Setup Time     Final   Value: 04/19/2013 09:44     Performed at [facility]   Culture     Final   Value: NO GROWTH     Performed at [facility]   Report Status 04/20/2013 FINAL   Final  MRSA PCR SCREENING     Status: None   Collection Time    04/19/13  2:40 AM      Result Value Range Status   MRSA by PCR NEGATIVE  NEGATIVE Final   Comment:            The GeneXpert MRSA Assay (FDA     approved for NASAL specimens     only), is one component of a     comprehensive MRSA colonization     surveillance program. It is not     intended to diagnose MRSA     infection nor to guide or     monitor treatment for     MRSA infections.  STOOL CULTURE     Status: None   Collection Time    04/19/13  8:36 AM      Result Value Range Status   Specimen Description STOOL   Final   Special Requests NONE   Final   Culture     Final   Value: NO SALMONELLA, SHIGELLA, CAMPYLOBACTER, YERSINIA, OR E.COLI 0157:H7 ISOLATED     Note: REDUCED NORMAL FLORA PRESENT     Performed at [facility]   Report Status 04/23/2013 FINAL   Final  WET PREP, GENITAL     Status:  Abnormal   Collection Time    04/19/13  1:28 PM      Result Value Range Status   Yeast Wet Prep HPF POC NONE SEEN  NONE SEEN Final   Trich, Wet Prep NONE SEEN  NONE SEEN Final   Clue Cells Wet Prep HPF POC NONE SEEN  NONE SEEN Final   WBC, Wet Prep HPF POC MANY (*) NONE SEEN Final   Studies/Results: No results found. Medications: I have reviewed the patient's current medications. Scheduled Meds: . antiseptic oral rinse  15 mL Mouth Rinse BID  . dronedarone  400 mg Oral BID WC  . feeding supplement (ENSURE COMPLETE)  237 mL Oral  BID BM  . pantoprazole (PROTONIX) IV  40 mg Intravenous Q12H  . piperacillin-tazobactam (ZOSYN)  IV  3.375 g Intravenous Q8H  . potassium chloride  40 mEq Oral Daily  . sodium chloride  3 mL Intravenous Q12H  . vancomycin  750 mg Intravenous Q24H   Continuous Infusions: . sodium chloride 20 mL/hr at 04/21/13 0700   PRN Meds:.ondansetron (ZOFRAN) IV, RESOURCE THICKENUP CLEAR, sodium chloride  Assessment/Plan: Breanna Guerrero is a 79 y.o. female PMH rectal cancer s/p neoadjuvant chemotherapy, radiation, and low anterior resection and diverting loop ileostomy on 01/07/13, hypertension who presented as a transfer from [facility] [place] with a chief complaint of altered mental status.    Septic shock  - Most likely due to HCAP PNA. Pt with last CXR on 11/21 with possible bilateral PNA. Leukocytosis improving. Blood cultures NGTD and procalcitonin down trending.   -Consider CT chest or repeat CXR if respiratory status declines -Continue IV zosyn (day 6) and vancomycin for HCAP -Wean off oxygen supplementation, currently on 5L [place] -PT consult   Paroxysmal atrial fibrillation - currently in NRS. Per cards to continue meds to end of January with digoxin level at time of discharge. -Continue dronedarone -Discontinue digoxin due to potential interaction with dronederone  -Continue on telemetry   Thrombocytopenia, improving - currently 138K.  Possibly due to severe sepsis. Also concerning for TTP due to presence of schistocytes on blood smear,  renal failure & neurological changes on admission, and elevated LDH with normal PT/PTT.  Pt with negative HIT Ab. DIC panel was negative. Currently without neurological symptoms and resolution of renal failure. -Contine to monitor CBC  -Monitor for bleeding  Microcytic Anemia - currently 7.0, on admission was 7.8 , received 1 PRBC however with transfusion reaction (possible TRALI).  Pt complaining of weakness and appears pale.   -Anemia panel in  process -Continue to monitor CBC -Transfuse to keep Hg>7 --> pt had possible transfusion rxn with last transfusion  Hypokalemia, resolved - Currently 3.9, most likely due to malnutrition  -Daily PO KCl 40 mEq   - monitor Mg level, try to keep >2  Hypoalbuminemia - Last albumin 1.8, most likely due to malnutrition -Nutrition consult -Dysphagia diet  -Ensure supplements  GERD- stable -Continue IV 40 mg Protonix daily  Dispo: Disposition is deferred at this time, awaiting improvement of current medical problems.  Anticipated discharge in approximately 2-3 day(s).   The patient does have a current PCP (Rufus Effie Shy III, MD) and does need an [facility] hospital follow-up appointment after discharge.  The patient does not have transportation limitations that hinder transportation to clinic appointments.  .Services Needed at time of discharge: Y = Yes, Blank = No PT:   OT:   RN:   Equipment:   Other:     LOS: 8 days  Evelena Peat, DO 04/25/2013, 9:23 AM

## 2013-04-25 NOTE — Progress Notes (Signed)
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 Apparently had hallucinations on IV amiodarone (??). With recurrent atrial flutter she was started on dronaderone and dig. She was notably hypokalemic yesterday at 2.8 with repletion ordeded  Echo demonstrated normal left ventricular function biatrial enlargement and a dilated IVC.  She has a history of rectal cancer and is status post radiation therapy resection diverting ileostomy and chemotherapy.  She is feeling stronger today and without chestpain  Past Medical History  Diagnosis Date  . Hypertension   . Stroke May 2013    mild stroke  . Hemorrhoids   . Cancer     Rectal adenocarcinoma    Scheduled Meds:  Scheduled Meds: . antiseptic oral rinse  15 mL Mouth Rinse BID  . dronedarone  400 mg Oral BID WC  . feeding supplement (ENSURE COMPLETE)  237 mL Oral BID BM  . pantoprazole (PROTONIX) IV  40 mg Intravenous Q12H  . piperacillin-tazobactam (ZOSYN)  IV  3.375 g Intravenous Q8H  . potassium chloride  40 mEq Oral Daily  . sodium chloride  3 mL Intravenous Q12H  . vancomycin  750 mg Intravenous Q24H   Continuous Infusions: . sodium chloride 20 mL/hr at 04/21/13 0700    PHYSICAL EXAM Filed Vitals:   04/24/13 0933 04/24/13 1400 04/24/13 2050 04/25/13 0513  BP:  122/57 137/62 130/55  Pulse: 84 77 95 83  Temp:  97.7 F (36.5 C) 97.6 F (36.4 C) 98.1 F (36.7 C)  TempSrc:  Oral Oral Oral  Resp:  16 18   Height:      Weight:      SpO2:  98% 98% 96%   Well developed and nourished in no acute distress     HENT normal Neck supple with JVP-flat Clear Regular rate and rhythm, no murmurs or gallops Abd-soft with active BS No Clubbing cyanosis edema Skin-warm and dry A & Oriented  Grossly normal sensory and motor function  TELEMETRY: Reviewed telemetry pt in NSR with  frequent atrial ectopy and nonsustained atrial tachycardia   Intake/Output Summary (Last 24 hours) at 04/25/13 0958 Last data filed at 04/24/13 2056  Gross per 24 hour  Intake    240 ml  Output    900 ml  Net   -660 ml    LABS: Basic Metabolic Panel:  Recent Labs Lab 04/20/13 1820 04/21/13 1800 04/22/13 0335 04/23/13 0522 04/24/13 1425 04/24/13 1936 04/25/13 0520  NA 144 139 140 139 137 137 139  K 3.8 2.5* 3.4* 2.8* 4.2 4.2 3.9  CL 111 101 104 103 104 103 106  CO2 23 23 25 26 24 23 27   GLUCOSE 76 212* 143* 144* 178* 201* 111*  BUN 9 43* 40* 25* 17 16 14   CREATININE 0.81 1.50* 1.45* 1.06 0.90 0.87 0.90  CALCIUM 8.2* 7.8* 7.6* 7.9* 7.6* 7.8* 8.2*  MG  --   --   --   --   --   --  1.5   Cardiac Enzymes: No results found for this basename: CKTOTAL, CKMB, CKMBINDEX, TROPONINI,  in the last 72 hours CBC:  Recent Labs Lab 04/19/13 0100 04/19/13 0357 04/20/13 0956 04/21/13 1300 04/22/13 0335 04/23/13 0522 04/24/13 1425 04/24/13 1936 04/25/13 0520  WBC 19.7* 17.8* 12.1* 14.5* 12.4* 12.2* 7.5 8.0 7.2  NEUTROABS 17.7* 16.8* 11.5* 13.4*  --   --  6.6  --  6.1  HGB 8.0* 7.8* 8.7* 7.7* 7.0* 7.5* 6.3* 6.9* 7.0*  HCT 21.8* 21.4* 24.0* 20.9* 19.3* 20.9* 17.8* 19.6* 20.4*  MCV 76.8* 77.8* 75.0* 76.0* 75.4* 78.0 79.5 79.7 81.3  PLT 74* 57* 39*  46* 37* 42* 66* 94* 109* 138*     ASSESSMENT AND PLAN: Atrial fibrillation  HFpEF  Anemia  Sepsis syndrome-resolved  Hypokalemia- resolved   Continue current meds Had a discussion with daughter regarding monitoring for Afib (LINQ-ILR) given prior stroke  Perioperative Afib, ectopy burden and demographics consistent with taht diagnosis Daughter understands, pt reluctant to consider  Signed, Sherryl Manges MD  04/25/2013

## 2013-04-25 NOTE — Progress Notes (Signed)
Speech Language Pathology Treatment: Dysphagia  Patient Details Name: Breanna Guerrero MRN: [id number] DOB: 01-19-1928 Today's Date: 04/25/2013 Time: 6578-4696 SLP Time Calculation (min): 22 min  Assessment / Plan / Recommendation Clinical Impression  MD paged treating SLP to reassess patient's swallow for possible diet upgrade to regular consistency and thin liquids per patient's request.  POC reviewed with patient and family member present (daughter-in-law) and recommendations made from [facility] completed on 04/21/13.  Patient had thin juice and milk on bedside table reporting she could have thin liquids.  Skilled education provided again for reasoning of rec's made from [facility].  Patient observed directly with thin tea by cup and min to moderate verbal cues to complete compensatory strategies to decrease risk of aspiration.  Review of vital signs indicate improved respiratory status but due to patient continuing to require cues to complete swallow strategies recommend to continue current POC. Continue nectar thick liquids during meals with only thin water or thin tea in between meals s/p oral care.  No straws with thin liquid. Patient and family member agree to POC.  ST to f/u on 04/26/13.     HPI HPI: MBS completed on 04/16/13 with diet rec's of dysphagia 3 and nectar thick liquids.  May have sips of thin water or tea in between meals following oral care when respiratory function WNL.  POC to upgrade when respiratory status consistent.        SLP Plan  Continue with current plan of care    Recommendations Diet recommendations: Dysphagia 3 (mechanical soft);Nectar-thick liquid Liquids provided via: Cup Supervision: Patient able to self feed;Full supervision/cueing for compensatory strategies Compensations: Slow rate;Small sips/bites;Clear throat intermittently Postural Changes and/or Swallow Maneuvers: Out of bed for meals;Seated upright 90 degrees;Upright 30-60 min after meal              Oral Care  Recommendations: Oral care before and after PO Plan: Continue with current plan of care    GO    Moreen Fowler MS, CCC-SLP [phone] [facility] 04/25/2013, 1:55 PM

## 2013-04-26 DIAGNOSIS — D509 Iron deficiency anemia, unspecified: Secondary | ICD-10-CM

## 2013-04-26 DIAGNOSIS — D696 Thrombocytopenia, unspecified: Secondary | ICD-10-CM

## 2013-04-26 DIAGNOSIS — E876 Hypokalemia: Secondary | ICD-10-CM

## 2013-04-26 LAB — CBC WITH DIFFERENTIAL/PLATELET
Basophils Absolute: 0 10*3/uL (ref 0.0–0.1)
Basophils Relative: 0 % (ref 0–1)
Eosinophils Absolute: 0.1 10*3/uL (ref 0.0–0.7)
Eosinophils Relative: 2 % (ref 0–5)
Hemoglobin: 7.1 g/dL — ABNORMAL LOW (ref 12.0–15.0)
Lymphs Abs: 0.7 10*3/uL (ref 0.7–4.0)
MCH: 28.1 pg (ref 26.0–34.0)
MCHC: 33.8 g/dL (ref 30.0–36.0)
MCV: 83 fL (ref 78.0–100.0)
Monocytes Absolute: 0.4 10*3/uL (ref 0.1–1.0)
Monocytes Relative: 5 % (ref 3–12)
Neutrophils Relative %: 85 % — ABNORMAL HIGH (ref 43–77)
RBC: 2.53 MIL/uL — ABNORMAL LOW (ref 3.87–5.11)

## 2013-04-26 LAB — BASIC METABOLIC PANEL
BUN: 11 mg/dL (ref 6–23)
CO2: 25 mEq/L (ref 19–32)
Creatinine, Ser: 0.83 mg/dL (ref 0.50–1.10)
GFR calc non Af Amer: 63 mL/min — ABNORMAL LOW (ref >90)
Glucose, Bld: 131 mg/dL — ABNORMAL HIGH (ref 70–99)
Potassium: 4 mEq/L (ref 3.5–5.1)
Sodium: 135 mEq/L (ref 135–145)

## 2013-04-26 LAB — CULTURE, BLOOD (ROUTINE X 2): Culture: NO GROWTH

## 2013-04-26 NOTE — Progress Notes (Signed)
Physical Therapy Treatment Patient Details Name: Breanna Guerrero MRN: [id number] DOB: May 01, 1928 Today's Date: 04/26/2013 Time: 4098-1191 PT Time Calculation (min): 42 min  PT Assessment / Plan / Recommendation  History of Present Illness Pt is a 79 y.o. female adm secondary to fall and dehydration. Found to have 2 remote fractures by CT 04/18/2013 ( Incompletely healed fractures of the right side of the symphysis pubis and right sacral ala)   PT Comments   Pt is progressing slowly with her mobility, but steadily every session with PT/OT.  She needs a lot of encouragement to progress and to commend her performance as she is down on herself.  [facility] is not able to accept her for her current diagnosis, so she is planning on d/c to SNF for rehab [facility] to be with her husband-he is there getting rehab as well).  Despite pt's anxiety during gait and 2/4 DOE, her O2 sats remained stable on 4-6 L O2 [place].    Follow Up Recommendations  SNF     Does the patient have the potential to tolerate intense rehabilitation    NA  Barriers to Discharge   None      Equipment Recommendations  Rolling walker with 5" wheels    Recommendations for Other Services   None  Frequency Min 2X/week   Progress towards PT Goals Progress towards PT goals: Progressing toward goals  Plan Discharge plan needs to be updated;Frequency needs to be updated    Precautions / Restrictions Precautions Precautions: Fall Precaution Comments: recent fall at home Restrictions Weight Bearing Restrictions: No   Pertinent Vitals/Pain O2 sats 98-99% on 4-6 L O2 [place] during gait.  DOE 2/4 with gait. HR in the 80s during activity.     Mobility  Bed Mobility Bed Mobility: Not assessed (pt OOB in the recliner chair. ) Transfers Transfers: Sit to Stand;Stand to Sit Sit to Stand: 4: Min guard;With upper extremity assist;With armrests;From chair/3-in-1 Stand to Sit: 4: Min guard;With upper extremity assist;With armrests;To  chair/3-in-1 Details for Transfer Assistance: min guard assist to steady trunk during transitions from recliner and 3-in-1 Ambulation/Gait Ambulation/Gait Assistance: 4: Min guard Ambulation Distance (Feet): 150 Feet Assistive device: Rolling walker Ambulation/Gait Assistance Details: 150' total with one sitting rest break at 86' in recliner chair.  Chair to follow to try to encourage increased gait distance and confidence in pt.  Pt reports increased DOE with gait 2/4 and O2 sats on 4-6L O2 [place] 98-99%.  HR 80s Gait Pattern: Step-through pattern;Shuffle;Trunk flexed Gait velocity: decreased    Exercises General Exercises - Upper Extremity Shoulder Flexion: AROM;Both;10 reps;Seated Elbow Flexion: AROM;Both;10 reps;Seated General Exercises - Lower Extremity Long Arc Quad: AROM;Both;10 reps;Seated Hip ABduction/ADduction: AROM;Both;10 reps;Seated (adduction against pillow for resistance) Hip Flexion/Marching: AROM;Both;10 reps;Seated Toe Raises: AROM;Both;10 reps;Seated Heel Raises: AROM;Both;10 reps;Seated     PT Goals (current goals can now be found in the care plan section) Acute Rehab PT Goals Patient Stated Goal: Go to [facility] and get better  Visit Information  Last PT Received On: 04/26/13 Assistance Needed: +1 (follow with chair helps encourage increased gait distance) History of Present Illness: Pt is a 79 y.o. female adm secondary to fall and dehydration. Found to have 2 remote fractures by CT 04/18/2013 ( Incompletely healed fractures of the right side of the symphysis pubis and right sacral ala)    Subjective Data  Subjective: Pt reports feeling weak all over.  Patient Stated Goal: Go to [facility] and get better   [employer]  Arousal/Alertness: Awake/alert Behavior During Therapy: WFL for tasks assessed/performed Overall Cognitive Status: Within Functional Limits for tasks assessed Following Commands: Follows one step commands consistently General Comments: pt with  depressed spirits    Balance  Balance Balance Assessed: Yes Static Sitting Balance Static Sitting - Balance Support: No upper extremity supported;Feet supported Static Sitting - Level of Assistance: 6: Modified independent (Device/Increase time) Static Sitting - Comment/# of Minutes: 5 min Static Standing Balance Static Standing - Balance Support: Bilateral upper extremity supported  End of Session PT - End of Session Equipment Utilized During Treatment: Oxygen Activity Tolerance: Patient limited by fatigue Patient left: in chair;with call bell/phone within reach;with family/visitor present     Lurena Joiner B. Kino Dunsworth, PT, DPT [phone]   04/26/2013, 11:34 AM

## 2013-04-26 NOTE — Progress Notes (Signed)
NUTRITION FOLLOW UP  DOCUMENTATION CODES  Per approved criteria   -Non-severe (moderate) malnutrition in the context of chronic illness    Intervention:    Continue Ensure Complete or Boost Plus PO BID between meals to maximize oral intake. Thicken as appropriate per SLP recommendations.  Milk with all meals.  Nutrition Dx:   Inadequate oral intake related to decreased appetite as evidenced by Meal Completion: <25%, improving.  Goal:   Pt to meet >/= 90% of their estimated nutrition needs; progressing.  Monitor:   PO intake, labs, weight trend.  Assessment:   PMH rectal cancer s/p neoadjuvant chemotherapy, radiation, and low anterior resection and diverting loop ileostomy on 01/07/13, hypertension who presents as a transfer from [facility] with a chief complaint of altered mental status. Her son-in-law Brett Canales was with her and helped provide the history. He is a radiologist here at [facility] and requested she be brought here for care.   PO intake somewhat improved; patient is consuming 10-50% of meals. Patient and her daughter report that intake and appetite are improving. Patient drinking a Boost Plus supplement this morning during RD visit. She said that the doctor brought her that and a half-pint of milk. She requests milk with all meals. Spoke with SLP regarding plans for potential diet advancement; hopeful to allow thin liquids with meals today.  Height: Ht Readings from Last 1 Encounters:  04/17/13 5\' 2"  (1.575 m)    Weight Status:   Wt Readings from Last 1 Encounters:  04/17/13 122 lb 5.7 oz (55.5 kg)    Re-estimated needs:  Kcal: 1400-1600  Protein: 65-80 grams  Fluid: >1.4 L/day  Skin: stage 1 sacral pressure ulcer  Diet Order: Dysphagia 3 with nectar thick liquids; thin tea and water ok between meals    Intake/Output Summary (Last 24 hours) at 04/26/13 0906 Last data filed at 04/26/13 0558  Gross per 24 hour  Intake    600 ml  Output   1925 ml  Net  -1325  ml    Last BM: 11/23   Labs:   Recent Labs Lab 04/24/13 1936 04/25/13 0520 04/26/13 0600  NA 137 139 135  K 4.2 3.9 4.0  CL 103 106 101  CO2 23 27 25   BUN 16 14 11   CREATININE 0.87 0.90 0.83  CALCIUM 7.8* 8.2* 8.2*  MG  --  1.5  --   GLUCOSE 201* 111* 131*    CBG (last 3)  No results found for this basename: GLUCAP,  in the last 72 hours  Scheduled Meds: . antiseptic oral rinse  15 mL Mouth Rinse BID  . dronedarone  400 mg Oral BID WC  . feeding supplement (ENSURE COMPLETE)  237 mL Oral BID BM  . pantoprazole (PROTONIX) IV  40 mg Intravenous Q12H  . piperacillin-tazobactam (ZOSYN)  IV  3.375 g Intravenous Q8H  . potassium chloride  40 mEq Oral Daily  . sodium chloride  3 mL Intravenous Q12H  . vancomycin  750 mg Intravenous Q24H    Continuous Infusions: . sodium chloride 20 mL/hr at 04/21/13 0700    Joaquin Courts, RD, LDN, CNSC Pager [phone] After Hours Pager [phone]

## 2013-04-26 NOTE — Progress Notes (Signed)
Speech Language Pathology Treatment: Dysphagia  Patient Details Name: Breanna Guerrero MRN: [id number] DOB: 29-Jun-1927 Today's Date: 04/26/2013 Time: 4098-1191 SLP Time Calculation (min): 8 min  Assessment / Plan / Recommendation Clinical Impression  Chart reviewed.  Prior MBS recommended due to increased respiratory work of breathing and difficulty coordinating swallowing and respiration.  During MBS she penetrated on 2 of 20 trials.  Pt.'s respiratory status is stable and pt. Has not exhibited SOB or increased work of breathing in past 3-4 days or longer.  Per report she does not consume the thick liquids is receiving thin (not on tray) and Recommend pt. Upgrade to thin liquids and resident in agreement.  No further follow up needed.  Continue Dys 3 for energy conservation.   HPI HPI: MBS completed on 04/16/13 with diet rec's of dysphagia 3 and nectar thick liquids.  May have sips of thin water or tea in between meals following oral care when respiratory function WNL.  POC to upgrade when respiratory status consistent.     Pertinent Vitals WDL  SLP Plan  All goals met    Recommendations Diet recommendations: Thin liquid;Dysphagia 3 (mechanical soft) Liquids provided via: Cup Medication Administration: Whole meds with puree Supervision: Patient able to self feed;Intermittent supervision to cue for compensatory strategies Compensations: Slow rate;Small sips/bites;Clear throat intermittently Postural Changes and/or Swallow Maneuvers: Out of bed for meals;Seated upright 90 degrees;Upright 30-60 min after meal              Oral Care Recommendations: Oral care before and after PO Follow up Recommendations: None Plan: All goals met    GO     Royce Macadamia M.Ed [employer] [phone]  04/26/2013

## 2013-04-26 NOTE — Progress Notes (Signed)
Subjective: Breanna Guerrero was seen and examined this morning.  She was sitting up in chair and her daughter was present.  She feels better and denies SOB.     Objective: Vital signs in last 24 hours: Filed Vitals:   04/25/13 0513 04/25/13 1400 04/25/13 2102 04/26/13 0600  BP: 130/55 127/55 122/69 130/52  Pulse: 83 81 81 80  Temp: 98.1 F (36.7 C) 98.6 F (37 C) 99.4 F (37.4 C) 97.9 F (36.6 C)  TempSrc: Oral Oral Oral Oral  Resp:   18 18  Height:      Weight:      SpO2: 96% 100% 100% 99%   Weight change:   Intake/Output Summary (Last 24 hours) at 04/26/13 1007 Last data filed at 04/26/13 0558  Gross per 24 hour  Intake    600 ml  Output   1925 ml  Net  -1325 ml   General: sitting in chair in NAD; looks much improved Neuro: Awake and alert, mentation is appropriate Lungs: CTA B/L   Heart: NSR, normal rate, no murmur  AB: bs+, soft, nontender, nondistended Ext: +1  LE edema B/L  Lab Results: Basic Metabolic Panel:  Recent Labs Lab 04/25/13 0520 04/26/13 0600  NA 139 135  K 3.9 4.0  CL 106 101  CO2 27 25  GLUCOSE 111* 131*  BUN 14 11  CREATININE 0.90 0.83  CALCIUM 8.2* 8.2*  MG 1.5  --    Liver Function Tests:  Recent Labs Lab 04/20/13 0956  AST 36  ALT 38*  ALKPHOS 352*  BILITOT 3.7*  PROT 5.4*  ALBUMIN 1.8*   CBC:  Recent Labs Lab 04/25/13 0520 04/26/13 0600  WBC 7.2 8.2  NEUTROABS 6.1 7.0  HGB 7.0* 7.1*  HCT 20.4* 21.0*  MCV 81.3 83.0  PLT 138* 188   D-Dimer:  Recent Labs Lab 04/20/13 0956  DDIMER 8.20*   CBG:  Recent Labs Lab 04/22/13 0116  GLUCAP 140*  140*   Coagulation:  Recent Labs Lab 04/20/13 0956  LABPROT 15.3*  INR 1.24   Anemia Panel:  Recent Labs Lab 04/25/13 0520  VITAMINB12 1822*  FOLATE 9.2  FERRITIN 610*  TIBC 198*  IRON 52  RETICCTPCT 2.9   Micro Results: Recent Results (from the past 240 hour(s))  CLOSTRIDIUM DIFFICILE BY PCR     Status: None   Collection Time    04/17/13  9:49 PM        Result Value Range Status   C difficile by pcr NEGATIVE  NEGATIVE Final  CULTURE, BLOOD (ROUTINE X 2)     Status: None   Collection Time    04/18/13  9:25 AM      Result Value Range Status   Specimen Description BLOOD RIGHT ARM   Final   Special Requests BOTTLES DRAWN AEROBIC ONLY 10CC   Final   Culture  Setup Time     Final   Value: 04/18/2013 17:55     Performed at [facility]   Culture     Final   Value:        BLOOD CULTURE RECEIVED NO GROWTH TO DATE CULTURE WILL BE HELD FOR 5 DAYS BEFORE ISSUING A FINAL NEGATIVE REPORT     Performed at [facility]   Report Status PENDING   Incomplete  CULTURE, BLOOD (ROUTINE X 2)     Status: None   Collection Time    04/18/13 11:07 AM      Result Value Range Status  Specimen Description BLOOD RIGHT ARM   Final   Special Requests BOTTLES DRAWN AEROBIC ONLY 10CC   Final   Culture  Setup Time     Final   Value: 04/18/2013 17:58     Performed at [facility]   Culture     Final   Value:        BLOOD CULTURE RECEIVED NO GROWTH TO DATE CULTURE WILL BE HELD FOR 5 DAYS BEFORE ISSUING A FINAL NEGATIVE REPORT     Performed at [facility]   Report Status PENDING   Incomplete  URINE CULTURE     Status: None   Collection Time    04/18/13 11:12 PM      Result Value Range Status   Specimen Description URINE, RANDOM   Final   Special Requests NONE   Final   Culture  Setup Time     Final   Value: 04/19/2013 09:44     Performed at [facility]   Culture     Final   Value: NO GROWTH     Performed at [facility]   Report Status 04/20/2013 FINAL   Final  URINE CULTURE     Status: None   Collection Time    04/19/13  2:06 AM      Result Value Range Status   Specimen Description URINE, RANDOM   Final   Special Requests PATIENT ON FOLLOWING ZOSYN   Final   Culture  Setup Time     Final   Value: 04/19/2013 09:44     Performed at [facility]   Culture     Final   Value: NO GROWTH      Performed at [facility]   Report Status 04/20/2013 FINAL   Final  MRSA PCR SCREENING     Status: None   Collection Time    04/19/13  2:40 AM      Result Value Range Status   MRSA by PCR NEGATIVE  NEGATIVE Final   Comment:            The GeneXpert MRSA Assay (FDA     approved for NASAL specimens     only), is one component of a     comprehensive MRSA colonization     surveillance program. It is not     intended to diagnose MRSA     infection nor to guide or     monitor treatment for     MRSA infections.  STOOL CULTURE     Status: None   Collection Time    04/19/13  8:36 AM      Result Value Range Status   Specimen Description STOOL   Final   Special Requests NONE   Final   Culture     Final   Value: NO SALMONELLA, SHIGELLA, CAMPYLOBACTER, YERSINIA, OR E.COLI 0157:H7 ISOLATED     Note: REDUCED NORMAL FLORA PRESENT     Performed at [facility]   Report Status 04/23/2013 FINAL   Final  WET PREP, GENITAL     Status: Abnormal   Collection Time    04/19/13  1:28 PM      Result Value Range Status   Yeast Wet Prep HPF POC NONE SEEN  NONE SEEN Final   Trich, Wet Prep NONE SEEN  NONE SEEN Final   Clue Cells Wet Prep HPF POC NONE SEEN  NONE SEEN Final   WBC, Wet Prep HPF POC MANY (*) NONE SEEN  Final   Studies/Results: No results found. Medications: I have reviewed the patient's current medications. Scheduled Meds: . antiseptic oral rinse  15 mL Mouth Rinse BID  . dronedarone  400 mg Oral BID WC  . feeding supplement (ENSURE COMPLETE)  237 mL Oral BID BM  . pantoprazole (PROTONIX) IV  40 mg Intravenous Q12H  . piperacillin-tazobactam (ZOSYN)  IV  3.375 g Intravenous Q8H  . potassium chloride  40 mEq Oral Daily  . sodium chloride  3 mL Intravenous Q12H  . vancomycin  750 mg Intravenous Q24H   Continuous Infusions: . sodium chloride 20 mL/hr at 04/21/13 0700   PRN Meds:.ondansetron (ZOFRAN) IV, RESOURCE THICKENUP CLEAR, sodium chloride  Assessment/Plan: Breanna Guerrero is a 79 y.o. female PMH rectal cancer s/p neoadjuvant chemotherapy, radiation, and low anterior resection and diverting loop ileostomy on 01/07/13, hypertension who presented as a transfer from [facility] with a chief complaint of altered mental status.    Septic shock, resolved  - Most likely due to HCAP PNA. Pt with last CXR on 11/21 with possible bilateral PNA. Transferred from ICU back to IMTS on 04/23/13.  Leukocytosis improving. Blood cultures NGTD and procalcitonin down trending.   -Consider CT chest or repeat CXR if respiratory status declines -Continue IV zosyn (day 9) and vancomycin (day 4) for HCAP -Wean off oxygen supplementation, currently on 5L [place] -PT consult  -SNF placement once patient stable for d/c; will contact CM and rehab coordinator regarding patient eligibility for SNF as she would like to go to the same facility her husband is at.   Paroxysmal atrial fibrillation - currently in NRS. Per cards to continue meds to end of January with digoxin level at time of discharge. -Continue dronedarone -Discontinued digoxin due to potential interaction with dronederone  -Continue on telemetry   Thrombocytopenia, resolved - currently 188K.  Possibly due to severe sepsis. Also concerning for TTP due to presence of schistocytes on blood smear,  renal failure & neurological changes on admission, and elevated LDH with normal PT/PTT.  Pt with negative HIT Ab. DIC panel was negative. Currently without neurological symptoms and resolution of renal failure. -Contine to monitor CBC  -Monitor for bleeding  Microcytic Anemia - currently 7.1, on admission was 7.8 , received 1 PRBC (04/18/13) however with transfusion reaction (possible TRALI).  Pt complaining of weakness and appears pale.   -Anemia panel in process -Continue to monitor CBC -Transfuse to keep Hg>7 --> pt had possible transfusion rxn with last transfusion  Hypokalemia, resolved - Currently 4.0, most likely due to  malnutrition  -Daily PO KCl 40 mEq   -monitor Mg level, try to keep >2  Hypoalbuminemia - Last albumin 1.8, most likely due to malnutrition -Nutrition consult -Dysphagia 3 diet  -Ensure supplements  GERD- stable -Continue IV 40 mg Protonix daily  Dispo: Disposition is deferred at this time, awaiting improvement of current medical problems.  Anticipated discharge in approximately 2-3 day(s).   The patient does have a current PCP (Rufus Effie Shy III, MD) and does need an [facility] hospital follow-up appointment after discharge.  The patient does not have transportation limitations that hinder transportation to clinic appointments.  .Services Needed at time of discharge: Y = Yes, Blank = No PT:   OT:   RN:   Equipment:   Other:     LOS: 9 days   Evelena Peat

## 2013-04-26 NOTE — Progress Notes (Signed)
  Date: 04/26/2013  Patient name: Breanna Guerrero  Medical record number: [id number]  Date of birth: [birth year]   This patient has been seen and the plan of care was discussed with the house staff. Please see their note for complete details. I concur with their findings with the following additions/corrections:  Breanna Guerrero continues to improve.  PT/OT recommend continued rehab, will get CM involved. Continue IV Abx for now.   [name], MD 04/26/2013, 3:03 PM

## 2013-04-26 NOTE — Progress Notes (Signed)
The Chaplain visited with patient today and offered emotional support. The Chaplain could not stay that long at all in the patient's room because the patient informed the Chaplain that she could not talk for long periods of time because of her bad cough. The patient was also in a bit of despair about her physical condition and asked the Chaplain for prayer right away. The Chaplain prayed with patient. Before the Chaplain left the room, the patient kept saying, "please pray for me, please pray for me. I hate living like this, oh God please help me". The Chaplain said to the patient that she will continue to be uplifted in a lot of prayers.  Chaplain Bryson Ha Esdras Delair

## 2013-04-26 NOTE — Progress Notes (Signed)
Occupational Therapy Treatment Patient Details Name: Breanna Guerrero MRN: [id number] DOB: November 23, 1927 Today's Date: 04/26/2013 Time: 4098-1191 OT Time Calculation (min): 33 min  OT Assessment / Plan / Recommendation  History of present illness Pt is a 79 y.o. female adm secondary to fall and dehydration. Found to have 2 remote fractures by CT 04/18/2013 ( Incompletely healed fractures of the right side of the symphysis pubis and right sacral ala)   OT comments  Pt progressing nicely toward OT goals which were upgraded today secondary to pt progress & increased activity tolerance w/ transfers and ADL tasks. Rec CIR consult.  Follow Up Recommendations  CIR    Barriers to Discharge       Equipment Recommendations  Other (comment) (Defer to next venue)    Recommendations for Other Services    Frequency Min 2X/week   Progress towards OT Goals Progress towards OT goals: Progressing toward goals (Goals updated secondary to progress)  Plan Discharge plan needs to be updated;Other (comment) (OT goals updated secondary to pt progress)    Precautions / Restrictions Precautions Precautions: Fall Precaution Comments: recent fall at home Restrictions Weight Bearing Restrictions: No   Pertinent Vitals/Pain No c/o    ADL  Eating/Feeding: Performed;Set up Where Assessed - Eating/Feeding: Chair Grooming: Performed;Wash/dry hands;Wash/dry face;Set up Where Assessed - Grooming: Supported sitting Toilet Transfer: Performed;Min guard Statistician Method: Sit to Barista: Materials engineer and Hygiene: Performed;Min guard Where Assessed - Engineer, mining and Hygiene: Sit to stand from 3-in-1 or toilet;Standing Transfers/Ambulation Related to ADLs: Min guard assist for transfers today. VC's for safety, sequencing w/ RW & hand placement. ADL Comments: Pt making nice gains w/ ADL's and functional transfers. Requires increased  time for tasks. Pt/daughter werre educated verbally in previous OT goals & need to upgrade secondary to progression. Discussed ADL's standing/sitting at sink as well as transfers into bathroom as able, pt /family in agreement. O2 @4L  via nasal cannula today & noted to be at  99% before, during and after session.    OT Diagnosis:    OT Problem List:   OT Treatment Interventions:     OT Goals(current goals can now be found in the care plan section) Acute Rehab OT Goals Patient Stated Goal: Go to [facility] and get better OT Goal Formulation: With family Time For Goal Achievement: 05/03/13 Potential to Achieve Goals: Good  Visit Information  Last OT Received On: 04/26/13 Assistance Needed: +1 History of Present Illness: Pt is a 79 y.o. female adm secondary to fall and dehydration. Found to have 2 remote fractures by CT 04/18/2013 ( Incompletely healed fractures of the right side of the symphysis pubis and right sacral ala)    Subjective Data      Prior Functioning       Cognition  Cognition Arousal/Alertness: Awake/alert Behavior During Therapy: WFL for tasks assessed/performed Overall Cognitive Status: Within Functional Limits for tasks assessed Following Commands: Follows one step commands consistently General Comments: pt with depressed spirits    Mobility  Bed Mobility Bed Mobility: Not assessed (Up in chair) Transfers Transfers: Sit to Stand;Stand to Sit Sit to Stand: 4: Min guard;From chair/3-in-1;With upper extremity assist;With armrests Stand to Sit: 4: Min guard;To chair/3-in-1;To toilet;With upper extremity assist;With armrests Details for Transfer Assistance: VC's for safety, sequencing and hand placement. Pt requires increased time and verbal encouragement.        Balance Balance Balance Assessed: Yes Static Sitting Balance Static Sitting - Balance Support: No  upper extremity supported;Feet supported Static Sitting - Level of Assistance: 6: Modified independent  (Device/Increase time) Static Sitting - Comment/# of Minutes: 5 min Static Standing Balance Static Standing - Balance Support: Bilateral upper extremity supported   End of Session OT - End of Session Equipment Utilized During Treatment: Gait belt;Rolling walker;Other (comment);Oxygen (3:1 4-5 steps away from chair) Activity Tolerance: Patient tolerated treatment well Patient left: in chair;with call bell/phone within reach;with family/visitor present Nurse Communication: Mobility status  GO     [name] 04/26/2013, 10:14 AM

## 2013-04-27 LAB — CBC WITH DIFFERENTIAL/PLATELET
Basophils Relative: 0 % (ref 0–1)
Eosinophils Absolute: 0.1 10*3/uL (ref 0.0–0.7)
Eosinophils Relative: 2 % (ref 0–5)
HCT: 19.6 % — ABNORMAL LOW (ref 36.0–46.0)
Lymphs Abs: 0.7 10*3/uL (ref 0.7–4.0)
MCH: 27.9 pg (ref 26.0–34.0)
MCHC: 34.2 g/dL (ref 30.0–36.0)
MCV: 81.7 fL (ref 78.0–100.0)
Monocytes Absolute: 0.4 10*3/uL (ref 0.1–1.0)
Neutrophils Relative %: 77 % (ref 43–77)
Platelets: 221 10*3/uL (ref 150–400)
RDW: 18.3 % — ABNORMAL HIGH (ref 11.5–15.5)
WBC: 5.3 10*3/uL (ref 4.0–10.5)

## 2013-04-27 LAB — BASIC METABOLIC PANEL
Calcium: 8.2 mg/dL — ABNORMAL LOW (ref 8.4–10.5)
Creatinine, Ser: 0.86 mg/dL (ref 0.50–1.10)
GFR calc Af Amer: 70 mL/min — ABNORMAL LOW (ref >90)
GFR calc non Af Amer: 61 mL/min — ABNORMAL LOW (ref >90)
Glucose, Bld: 100 mg/dL — ABNORMAL HIGH (ref 70–99)
Potassium: 4.1 mEq/L (ref 3.5–5.1)
Sodium: 133 mEq/L — ABNORMAL LOW (ref 135–145)

## 2013-04-27 NOTE — Progress Notes (Addendum)
  Date: 04/27/2013  Patient name: Breanna Guerrero  Medical record number: [id number]  Date of birth: [birth year]   This patient has been seen and the plan of care was discussed with the house staff. Please see their note for complete details. I concur with their findings with the following additions/corrections:  Issues which are keeping Breanna Guerrero hospitalized include - -   HCAP, Sepsis: Abx should be done today (Zosyn day 10).  She has received intermittent vancomycin, but I believe as she was greatly improving on Zosyn only, there is no need to continue this medication at this time.  Vanc can be d/c'd as well.  She does continue to have an oxygen demand on [facility].  She should be weaned off of this therapy prior to discharge if possible  Normocytic anemia: Hgb today 6.7, she is asymptomatic except fatigue.  She has refused blood transfusion today.  There is some suggestion in the chart that she could have had a TRALI with her transfusion previously.  She has relatively normal iron studies with an elevated ferritin.  We will limit blood draws, ask for pediatric tubes to check hemoglobin.  She had a hemocult on first admission which was negative.  Hemolytic labs revealed an LDH of 281, a haptoglobin of 328 and schistocytes on smear 11/17, but resolved on 11/18, retic count was normal.  On her most recent smear, she has target cells.  Possibly she had a mild hemolysis during her acute sepsis (also with thrombocytopenia) which has resolved and she just has delayed bone marrow responsiveness to replace her blood cells.  We will hold off on transfusion today and transfuse tomorrow if continues to be downtrending after discussion with patient and family.  Consider rechecking Hemocult if blood counts continue to drop.   Placement: SNF is required, she wants to go to a specific place.  Currently working with SW to see if insurance will pay for this facility, [facility].   Family and patient updated at bedside  today.   Breanna Catalina, MD 04/27/2013, 11:36 AM

## 2013-04-27 NOTE — Progress Notes (Signed)
Subjective: Breanna Guerrero was seen and examined this morning.  She was sitting up in chair eating breakfast and her daughter was present.  She feels better and denies SOB.  She would like fewer blood draws.   Objective: Vital signs in last 24 hours: Filed Vitals:   04/26/13 1414 04/26/13 1618 04/26/13 2109 04/27/13 0645  BP: 116/52  100/45 139/59  Pulse: 77  74 78  Temp: 99.2 F (37.3 C)  97.9 F (36.6 C) 97.7 F (36.5 C)  TempSrc:   Oral Oral  Resp: 17  18 18   Height:      Weight:      SpO2: 100% 98% 100% 100%   Weight change:   Intake/Output Summary (Last 24 hours) at 04/27/13 1204 Last data filed at 04/27/13 0930  Gross per 24 hour  Intake    240 ml  Output   2200 ml  Net  -1960 ml   General: sitting in chair in NAD; looks much improved Neuro: Awake and alert, mentation is appropriate Lungs: CTA B/L   Heart: NSR, normal rate, no murmur  AB: bs+, soft, nontender, nondistended Ext: +1  LE edema B/L  Lab Results: Basic Metabolic Panel:  Recent Labs Lab 04/25/13 0520 04/26/13 0600 04/27/13 0550  NA 139 135 133*  K 3.9 4.0 4.1  CL 106 101 100  CO2 27 25 24   GLUCOSE 111* 131* 100*  BUN 14 11 10   CREATININE 0.90 0.83 0.86  CALCIUM 8.2* 8.2* 8.2*  MG 1.5  --   --    CBC:  Recent Labs Lab 04/26/13 0600 04/27/13 0550  WBC 8.2 5.3  NEUTROABS 7.0 4.1  HGB 7.1* 6.7*  HCT 21.0* 19.6*  MCV 83.0 81.7  PLT 188 221   CBG:  Recent Labs Lab 04/22/13 0116  GLUCAP 140*  140*   Anemia Panel:  Recent Labs Lab 04/25/13 0520  VITAMINB12 1822*  FOLATE 9.2  FERRITIN 610*  TIBC 198*  IRON 52  RETICCTPCT 2.9   Micro Results: Recent Results (from the past 240 hour(s))  CLOSTRIDIUM DIFFICILE BY PCR     Status: None   Collection Time    04/17/13  9:49 PM      Result Value Range Status   C difficile by pcr NEGATIVE  NEGATIVE Final  CULTURE, BLOOD (ROUTINE X 2)     Status: None   Collection Time    04/18/13  9:25 AM      Result Value Range Status   Specimen Description BLOOD RIGHT ARM   Final   Special Requests BOTTLES DRAWN AEROBIC ONLY 10CC   Final   Culture  Setup Time     Final   Value: 04/18/2013 17:55     Performed at [facility]   Culture     Final   Value: NO GROWTH 5 DAYS     Performed at [facility]   Report Status 04/26/2013 FINAL   Final  CULTURE, BLOOD (ROUTINE X 2)     Status: None   Collection Time    04/18/13 11:07 AM      Result Value Range Status   Specimen Description BLOOD RIGHT ARM   Final   Special Requests BOTTLES DRAWN AEROBIC ONLY 10CC   Final   Culture  Setup Time     Final   Value: 04/18/2013 17:58     Performed at [facility]   Culture     Final   Value: NO GROWTH 5 DAYS  Performed at [facility]   Report Status 04/26/2013 FINAL   Final  URINE CULTURE     Status: None   Collection Time    04/18/13 11:12 PM      Result Value Range Status   Specimen Description URINE, RANDOM   Final   Special Requests NONE   Final   Culture  Setup Time     Final   Value: 04/19/2013 09:44     Performed at [facility]   Culture     Final   Value: NO GROWTH     Performed at [facility]   Report Status 04/20/2013 FINAL   Final  URINE CULTURE     Status: None   Collection Time    04/19/13  2:06 AM      Result Value Range Status   Specimen Description URINE, RANDOM   Final   Special Requests PATIENT ON FOLLOWING ZOSYN   Final   Culture  Setup Time     Final   Value: 04/19/2013 09:44     Performed at [facility]   Culture     Final   Value: NO GROWTH     Performed at [facility]   Report Status 04/20/2013 FINAL   Final  MRSA PCR SCREENING     Status: None   Collection Time    04/19/13  2:40 AM      Result Value Range Status   MRSA by PCR NEGATIVE  NEGATIVE Final   Comment:            The GeneXpert MRSA Assay (FDA     approved for NASAL specimens     only), is one component of a     comprehensive MRSA colonization      surveillance program. It is not     intended to diagnose MRSA     infection nor to guide or     monitor treatment for     MRSA infections.  STOOL CULTURE     Status: None   Collection Time    04/19/13  8:36 AM      Result Value Range Status   Specimen Description STOOL   Final   Special Requests NONE   Final   Culture     Final   Value: NO SALMONELLA, SHIGELLA, CAMPYLOBACTER, YERSINIA, OR E.COLI 0157:H7 ISOLATED     Note: REDUCED NORMAL FLORA PRESENT     Performed at [facility]   Report Status 04/23/2013 FINAL   Final  WET PREP, GENITAL     Status: Abnormal   Collection Time    04/19/13  1:28 PM      Result Value Range Status   Yeast Wet Prep HPF POC NONE SEEN  NONE SEEN Final   Trich, Wet Prep NONE SEEN  NONE SEEN Final   Clue Cells Wet Prep HPF POC NONE SEEN  NONE SEEN Final   WBC, Wet Prep HPF POC MANY (*) NONE SEEN Final   Studies/Results: No results found. Medications: I have reviewed the patient's current medications. Scheduled Meds: . antiseptic oral rinse  15 mL Mouth Rinse BID  . dronedarone  400 mg Oral BID WC  . feeding supplement (ENSURE COMPLETE)  237 mL Oral BID BM  . pantoprazole (PROTONIX) IV  40 mg Intravenous Q12H  . piperacillin-tazobactam (ZOSYN)  IV  3.375 g Intravenous Q8H  . potassium chloride  40 mEq Oral Daily  . sodium chloride  3 mL Intravenous  Q12H  . vancomycin  750 mg Intravenous Q24H   Continuous Infusions: . sodium chloride 20 mL/hr at 04/21/13 0700   PRN Meds:.ondansetron (ZOFRAN) IV, RESOURCE THICKENUP CLEAR, sodium chloride  Assessment/Plan: [name] is a 79 y.o. female PMH rectal cancer s/p neoadjuvant chemotherapy, radiation, and low anterior resection and diverting loop ileostomy on 01/07/13, hypertension who presented as a transfer from [facility] with a chief complaint of altered mental status.    Septic shock, resolved  - Most likely due to HCAP PNA. Pt with last CXR on 11/21 with possible bilateral PNA.  Transferred from ICU back to IMTS on 04/23/13.  Leukocytosis improving. Blood cultures NGTD and procalcitonin down trending.  Has received  IV zosyn (day 10) and vancomycin (day 5) for HCAP.  MRSA negative so will d/c vanc today.  Can stop zosyn since 10 days is a full course.  Tolerating oxygen wean.  Walked with PT yesterday. -continue to wean oxygen, currently on 2L [place] -SNF placement once patient stable for d/c; bed is available at [facility]; SW assisting with determining insurance coverage.  Paroxysmal atrial fibrillation - currently in NRS. Per cards to continue meds to end of January with digoxin level at time of discharge.  Patient declined loop recorder. -Continue dronedarone -Discontinued digoxin due to potential interaction with dronederone  -Continue on telemetry   Thrombocytopenia, resolved - currently 221K.  Possibly due to severe sepsis. Also concerning for TTP due to presence of schistocytes on blood smear,  renal failure & neurological changes on admission, and elevated LDH with normal PT/PTT.  Pt with negative HIT Ab. DIC panel was negative. Currently without neurological symptoms and resolution of renal failure. -Contine to monitor CBC  -Monitor for bleeding  Microcytic Anemia - 6.7 today; on admission was 7.8 , received 1 PRBC (04/18/13) however with transfusion reaction (possible TRALI).  Pt has of weakness but was able to walk with PT yesterday and denies SOB on 2L.  -will switch to daily H&H instead of CBC (WBC and plt stable); will request pediatric tubes -patient declines transfusion today, agreeable to continued monitoring of H&H  Hypokalemia, resolved - Currently 4.1, most likely due to malnutrition  -Daily PO KCl 40 mEq    Hypoalbuminemia - Last albumin 1.8, most likely due to malnutrition -Nutrition consult -Dysphagia 3 diet  -Ensure supplements  GERD- stable -Continue IV 40 mg Protonix daily  Dispo: Disposition is deferred at this time, awaiting improvement of  current medical problems.  Anticipated discharge in approximately 2-3 day(s).   The patient does have a current PCP (Rufus Effie Shy III, MD) and does need an [facility] hospital follow-up appointment after discharge.  The patient does not have transportation limitations that hinder transportation to clinic appointments.  .Services Needed at time of discharge: Y = Yes, Blank = No PT:   OT:   RN:   Equipment:   Other:     LOS: 10 days   Evelena Peat

## 2013-04-27 NOTE — Progress Notes (Signed)
Patient ID: Breanna Guerrero, female   DOB: [birth year], 79 y.o.   MRN: [id number]    Subjective:  Weak :They draw too much blood from me  Wants rehab No dyspnea chest pain or palpitations  Objective:  Filed Vitals:   04/26/13 1414 04/26/13 1618 04/26/13 2109 04/27/13 0645  BP: 116/52  100/45 139/59  Pulse: 77  74 78  Temp: 99.2 F (37.3 C)  97.9 F (36.6 C) 97.7 F (36.5 C)  TempSrc:   Oral Oral  Resp: 17  18 18   Height:      Weight:      SpO2: 100% 98% 100% 100%    Intake/Output from previous day:  Intake/Output Summary (Last 24 hours) at 04/27/13 0751 Last data filed at 04/27/13 0704  Gross per 24 hour  Intake      0 ml  Output   2200 ml  Net  -2200 ml    Physical Exam: Frail thin female Crackles and rales bilaterally Normal heart sounds Ileostomy Plus 2 PT No edema Neuro non focal  Lab Results: Basic Metabolic Panel:  Recent Labs  44/03/47 0520 04/26/13 0600 04/27/13 0550  NA 139 135 133*  K 3.9 4.0 4.1  CL 106 101 100  CO2 27 25 24   GLUCOSE 111* 131* 100*  BUN 14 11 10   CREATININE 0.90 0.83 0.86  CALCIUM 8.2* 8.2* 8.2*  MG 1.5  --   --    CBC:  Recent Labs  04/26/13 0600 04/27/13 0550  WBC 8.2 5.3  NEUTROABS 7.0 4.1  HGB 7.1* 6.7*  HCT 21.0* 19.6*  MCV 83.0 81.7  PLT 188 221    Imaging:  Cardiac Studies:  ECG:  NSR no acute ischemic changes   Telemetry:  NSR no fib flutter 04/27/2013   Echo: EF normal mild MR  Medications:   . antiseptic oral rinse  15 mL Mouth Rinse BID  . dronedarone  400 mg Oral BID WC  . feeding supplement (ENSURE COMPLETE)  237 mL Oral BID BM  . pantoprazole (PROTONIX) IV  40 mg Intravenous Q12H  . piperacillin-tazobactam (ZOSYN)  IV  3.375 g Intravenous Q8H  . potassium chloride  40 mEq Oral Daily  . sodium chloride  3 mL Intravenous Q12H  . vancomycin  750 mg Intravenous Q24H     . sodium chloride 20 mL/hr at 04/21/13 0700    Assessment/Plan:  PAF:  NSR cannot tolerate iv amiodarone due to  hallucinations although I suspect this has more to do with her overall medical condition Continue  multaq for now.  She is not interested in ILD as recommended by Dr Charleston Ropes [facility] 04/27/2013, 7:51 AM

## 2013-04-27 NOTE — Progress Notes (Signed)
Pt's hmg is 6.7. Md paged to day shifts nurse phone. Day shift nurse aware.

## 2013-04-27 NOTE — Progress Notes (Signed)
Spoke with Jasmine December the admissions coordinator at [facility]. Asked about IV access and PICC lines. She is getting family to fill out paper work tomorrow in case patient is discharged Friday. Nurse informed her of patients readiness to leave as far as hemoglobin and O2 status. She left her personal cell if need to be reached for patient admission [phone].

## 2013-04-28 LAB — HEMOGLOBIN AND HEMATOCRIT, BLOOD
HCT: 21.3 % — ABNORMAL LOW (ref 36.0–46.0)
Hemoglobin: 7.2 g/dL — ABNORMAL LOW (ref 12.0–15.0)

## 2013-04-28 NOTE — Clinical Social Work Note (Signed)
CSW spoke to [place] at [place], [place] who confirmed that insurance should not be an issue for this patient.  Jasmine December stated that the pt had Medicare Complete and a Architectural technologist.  Jasmine December also stated that this information has been discussed with the family.  Jasmine December is also aware of the projected dc of Friday pending medical stability.  CSW contacted MD to provide update.  Vickii Penna, LCSWA [phone]  Clinical Social Work

## 2013-04-28 NOTE — Progress Notes (Signed)
Subjective: Breanna Guerrero was seen and examined this morning.  She was sitting up in chair and her daughter was present.  She denies SOB.       Objective: Vital signs in last 24 hours: Filed Vitals:   04/27/13 0645 04/27/13 1215 04/27/13 2107 04/28/13 0406  BP: 139/59 121/72 110/65 124/73  Pulse: 78 76 73 72  Temp: 97.7 F (36.5 C) 97.6 F (36.4 C) 98.1 F (36.7 C) 97.8 F (36.6 C)  TempSrc: Oral Oral Oral Oral  Resp: 18 18 18 18   Height:      Weight:      SpO2: 100% 100% 99% 99%   Weight change:   Intake/Output Summary (Last 24 hours) at 04/28/13 1031 Last data filed at 04/27/13 1700  Gross per 24 hour  Intake    560 ml  Output    975 ml  Net   -415 ml   General: sitting in chair in NAD; looks much improved Neuro: Awake and alert, mentation is appropriate Lungs: CTA B/L   Heart: NSR, normal rate, no murmur  AB: bs+, soft, nontender, nondistended Ext: +1  LE edema B/L  Lab Results: Basic Metabolic Panel:  Recent Labs Lab 04/25/13 0520 04/26/13 0600 04/27/13 0550  NA 139 135 133*  K 3.9 4.0 4.1  CL 106 101 100  CO2 27 25 24   GLUCOSE 111* 131* 100*  BUN 14 11 10   CREATININE 0.90 0.83 0.86  CALCIUM 8.2* 8.2* 8.2*  MG 1.5  --   --    CBC:  Recent Labs Lab 04/26/13 0600 04/27/13 0550 04/28/13 0530  WBC 8.2 5.3  --   NEUTROABS 7.0 4.1  --   HGB 7.1* 6.7* 7.2*  HCT 21.0* 19.6* 21.3*  MCV 83.0 81.7  --   PLT 188 221  --    CBG:  Recent Labs Lab 04/22/13 0116  GLUCAP 140*  140*   Anemia Panel:  Recent Labs Lab 04/25/13 0520  VITAMINB12 1822*  FOLATE 9.2  FERRITIN 610*  TIBC 198*  IRON 52  RETICCTPCT 2.9   Micro Results: Recent Results (from the past 240 hour(s))  CULTURE, BLOOD (ROUTINE X 2)     Status: None   Collection Time    04/18/13 11:07 AM      Result Value Range Status   Specimen Description BLOOD RIGHT ARM   Final   Special Requests BOTTLES DRAWN AEROBIC ONLY 10CC   Final   Culture  Setup Time     Final   Value:  04/18/2013 17:58     Performed at [facility]   Culture     Final   Value: NO GROWTH 5 DAYS     Performed at [facility]   Report Status 04/26/2013 FINAL   Final  URINE CULTURE     Status: None   Collection Time    04/18/13 11:12 PM      Result Value Range Status   Specimen Description URINE, RANDOM   Final   Special Requests NONE   Final   Culture  Setup Time     Final   Value: 04/19/2013 09:44     Performed at [facility]   Culture     Final   Value: NO GROWTH     Performed at [facility]   Report Status 04/20/2013 FINAL   Final  URINE CULTURE     Status: None   Collection Time    04/19/13  2:06 AM  Result Value Range Status   Specimen Description URINE, RANDOM   Final   Special Requests PATIENT ON FOLLOWING ZOSYN   Final   Culture  Setup Time     Final   Value: 04/19/2013 09:44     Performed at [facility]   Culture     Final   Value: NO GROWTH     Performed at [facility]   Report Status 04/20/2013 FINAL   Final  MRSA PCR SCREENING     Status: None   Collection Time    04/19/13  2:40 AM      Result Value Range Status   MRSA by PCR NEGATIVE  NEGATIVE Final   Comment:            The GeneXpert MRSA Assay (FDA     approved for NASAL specimens     only), is one component of a     comprehensive MRSA colonization     surveillance program. It is not     intended to diagnose MRSA     infection nor to guide or     monitor treatment for     MRSA infections.  STOOL CULTURE     Status: None   Collection Time    04/19/13  8:36 AM      Result Value Range Status   Specimen Description STOOL   Final   Special Requests NONE   Final   Culture     Final   Value: NO SALMONELLA, SHIGELLA, CAMPYLOBACTER, YERSINIA, OR E.COLI 0157:H7 ISOLATED     Note: REDUCED NORMAL FLORA PRESENT     Performed at [facility]   Report Status 04/23/2013 FINAL   Final  WET PREP, GENITAL     Status: Abnormal   Collection Time     04/19/13  1:28 PM      Result Value Range Status   Yeast Wet Prep HPF POC NONE SEEN  NONE SEEN Final   Trich, Wet Prep NONE SEEN  NONE SEEN Final   Clue Cells Wet Prep HPF POC NONE SEEN  NONE SEEN Final   WBC, Wet Prep HPF POC MANY (*) NONE SEEN Final   Studies/Results: No results found. Medications: I have reviewed the patient's current medications. Scheduled Meds: . antiseptic oral rinse  15 mL Mouth Rinse BID  . dronedarone  400 mg Oral BID WC  . feeding supplement (ENSURE COMPLETE)  237 mL Oral BID BM  . pantoprazole (PROTONIX) IV  40 mg Intravenous Q12H  . potassium chloride  40 mEq Oral Daily  . sodium chloride  3 mL Intravenous Q12H   Continuous Infusions: . sodium chloride 20 mL/hr at 04/21/13 0700   PRN Meds:.ondansetron (ZOFRAN) IV, RESOURCE THICKENUP CLEAR, sodium chloride  Assessment/Plan: Breanna Guerrero is a 79 y.o. female PMH rectal cancer s/p neoadjuvant chemotherapy, radiation, and low anterior resection and diverting loop ileostomy on 01/07/13, hypertension who presented as a transfer from [facility] with a chief complaint of altered mental status.    Septic shock, resolved  - Most likely due to HCAP PNA. Pt with last CXR on 11/21 with possible bilateral PNA. Transferred from ICU back to IMTS on 04/23/13.  Leukocytosis improving. Blood cultures NGTD and procalcitonin down trending.  Has received  IV zosyn (day 10) and vancomycin (day 5) for HCAP.  MRSA negative.  Vanc and Zosyn completed 04/17/13.  Tolerating oxygen wean.   -continue to wean oxygen, currently on 2L [place] -SNF placement once patient  stable for d/c; SW has been of great assistance; patient set for d/c to [facility] on 04/30/13  Paroxysmal atrial fibrillation - currently in NRS. Per cards to continue meds to end of January with digoxin level at time of discharge.  Patient declined loop recorder. -Continue dronedarone -Discontinued digoxin due to potential interaction with dronederone  -Continue on  telemetry   Thrombocytopenia, resolved - Possibly due to severe sepsis. Also concerning for TTP due to presence of schistocytes on blood smear,  renal failure & neurological changes on admission, and elevated LDH with normal PT/PTT.  Pt with negative HIT Ab. DIC panel was negative. Currently without neurological symptoms and resolution of renal failure. -Monitor for bleeding  Microcytic Anemia, improving - 6.7--> 7.2 without intervention; on admission was 7.8 , received 1 PRBC (04/18/13) however with transfusion reaction (possible TRALI).  Pt has weakness but was able to walk with PT and denies SOB on 2L.  -Continue daily H&H  Hypokalemia, resolved -  most likely due to malnutrition  -Continue daily PO KCl 40 mEq    Hypoalbuminemia - Last albumin 1.8, most likely due to malnutrition -Nutrition consult -Dysphagia 3 diet  -Ensure supplements  GERD- stable -Continue IV 40 mg Protonix daily  Dispo: Disposition is deferred at this time, awaiting improvement of current medical problems.  Anticipated discharge to SNF in approximately 2 day(s).   The patient does have a current PCP (Rufus Effie Shy III, MD) and does need an [facility] hospital follow-up appointment after discharge.  The patient does not have transportation limitations that hinder transportation to clinic appointments.  .Services Needed at time of discharge: Y = Yes, Blank = No PT:   OT:   RN:   Equipment:   Other:     LOS: 11 days   Evelena Peat

## 2013-04-28 NOTE — Progress Notes (Signed)
  Date: 04/28/2013  Patient name: Breanna Guerrero  Medical record number: [id number]  Date of birth: 11/07/1927   This patient has been seen and the plan of care was discussed with the house staff. Please see their note for complete details. I concur with their findings with the following additions/corrections:  H/H improved today with decreased blood draws.  Will continue to monitor.  Pending issues include weaning oxygen to off (down to [facility]) and monitoring CBC for continued improvement; She has had normocytic anemia possibly with a hemolytic component early in her course due to severe sepsis.  Her H/H have stabilized and somewhat improved today. She is due to be transferred to SNF.  She will likely be discharged on Friday.   [name], MD 04/28/2013, 1:22 PM

## 2013-04-29 LAB — HEMOGLOBIN AND HEMATOCRIT, BLOOD: HCT: 23.3 % — ABNORMAL LOW (ref 36.0–46.0)

## 2013-04-29 MED ORDER — PANTOPRAZOLE SODIUM 40 MG PO TBEC
40.0000 mg | DELAYED_RELEASE_TABLET | Freq: Every day | ORAL | Status: DC
  Administered 2013-04-29 – 2013-04-30 (×2): 40 mg via ORAL
  Filled 2013-04-29 (×2): qty 1

## 2013-04-29 NOTE — Progress Notes (Addendum)
Subjective: Breanna Guerrero was seen and examined this morning.  She was sitting up in bed eating her breakfast and daughter was present.  She says she feels fine and denies SOB.    Objective: Vital signs in last 24 hours: Filed Vitals:   04/27/13 2107 04/28/13 0406 04/28/13 2020 04/29/13 0601  BP: 110/65 124/73 138/65 148/73  Pulse: 73 72 79 79  Temp: 98.1 F (36.7 C) 97.8 F (36.6 C) 97.9 F (36.6 C) 97.8 F (36.6 C)  TempSrc: Oral Oral Oral Oral  Resp: 18 18 18 19   Height:      Weight:      SpO2: 99% 99% 100% 100%   Weight change:   Intake/Output Summary (Last 24 hours) at 04/29/13 0933 Last data filed at 04/29/13 0500  Gross per 24 hour  Intake      6 ml  Output   1475 ml  Net  -1469 ml   General: in bed in NAD; looks much improved Neuro: Awake and alert, mentation is appropriate Lungs: CTA B/L   Heart: NSR, normal rate, no murmur  AB: bs+, soft, nontender, nondistended Ext: +1  LE edema B/L  Lab Results: Basic Metabolic Panel:  Recent Labs Lab 04/25/13 0520 04/26/13 0600 04/27/13 0550  NA 139 135 133*  K 3.9 4.0 4.1  CL 106 101 100  CO2 27 25 24   GLUCOSE 111* 131* 100*  BUN 14 11 10   CREATININE 0.90 0.83 0.86  CALCIUM 8.2* 8.2* 8.2*  MG 1.5  --   --    CBC:  Recent Labs Lab 04/26/13 0600 04/27/13 0550 04/28/13 0530  WBC 8.2 5.3  --   NEUTROABS 7.0 4.1  --   HGB 7.1* 6.7* 7.2*  HCT 21.0* 19.6* 21.3*  MCV 83.0 81.7  --   PLT 188 221  --    Anemia Panel:  Recent Labs Lab 04/25/13 0520  VITAMINB12 1822*  FOLATE 9.2  FERRITIN 610*  TIBC 198*  IRON 52  RETICCTPCT 2.9   Micro Results: Recent Results (from the past 240 hour(s))  WET PREP, GENITAL     Status: Abnormal   Collection Time    04/19/13  1:28 PM      Result Value Range Status   Yeast Wet Prep HPF POC NONE SEEN  NONE SEEN Final   Trich, Wet Prep NONE SEEN  NONE SEEN Final   Clue Cells Wet Prep HPF POC NONE SEEN  NONE SEEN Final   WBC, Wet Prep HPF POC MANY (*) NONE SEEN  Final   Studies/Results: No results found. Medications: I have reviewed the patient's current medications. Scheduled Meds: . antiseptic oral rinse  15 mL Mouth Rinse BID  . dronedarone  400 mg Oral BID WC  . feeding supplement (ENSURE COMPLETE)  237 mL Oral BID BM  . pantoprazole (PROTONIX) IV  40 mg Intravenous Q12H  . potassium chloride  40 mEq Oral Daily  . sodium chloride  3 mL Intravenous Q12H   Continuous Infusions: . sodium chloride 10 mL/hr at 04/28/13 2218   PRN Meds:.ondansetron (ZOFRAN) IV, RESOURCE THICKENUP CLEAR, sodium chloride  Assessment/Plan: Breanna Guerrero is a 79 y.o. female PMH rectal cancer s/p neoadjuvant chemotherapy, radiation, and low anterior resection and diverting loop ileostomy on 01/07/13, hypertension who presented as a transfer from [facility] with a chief complaint of altered mental status.    Septic shock, resolved  - Most likely due to HCAP PNA. Pt with last CXR on 11/21 with possible  bilateral PNA. Transferred from ICU back to IMTS on 04/23/13.  Leukocytosis improving. Blood cultures NGTD and procalcitonin down trending.  Has received  IV zosyn (day 10) and vancomycin (day 5) for HCAP.  MRSA negative.  Vanc and Zosyn completed 04/17/13.  Tolerating oxygen wean.   -continue to wean oxygen, currently on 2L [place] -SNF placement once patient stable for d/c; SW has been of great assistance; patient set for d/c to [facility] on 04/30/13  Paroxysmal atrial fibrillation - currently in NRS.  Patient declined loop recorder.  Continue dronedarone until after ileostomy reversal surgery.  -Continue dronedarone -Discontinued digoxin due to potential interaction with dronederone  -Continue on telemetry   Thrombocytopenia, resolved - Possibly due to severe sepsis. Also concerning for TTP due to presence of schistocytes on blood smear,  renal failure & neurological changes on admission, and elevated LDH with normal PT/PTT.  Pt with negative HIT Ab. DIC panel was  negative. Currently without neurological symptoms and has had resolution of renal failure. -Monitor for bleeding  Microcytic Anemia, improving - 6.7--> 7.2 without intervention; on admission was 7.8 , received 1 PRBC (04/18/13) however with transfusion reaction (possible TRALI).  Pt has weakness but was able to walk with PT and denies SOB on 2L.  -Continue daily H&H; she declined blood draw this morning because she said they attempted to draw three tubes.  Hypokalemia, resolved -  most likely due to malnutrition  -Continue daily PO KCl 40 mEq    Hypoalbuminemia - Last albumin 1.8, most likely due to malnutrition -Nutrition consult -Dysphagia 3 diet  -Ensure supplements  GERD- stable -IV protonix --> po protonix  Dispo: [name] is much improved and is stable for discharge to SNF tomorrow.  The patient does have a current PCP (Rufus Effie Shy III, MD) and does need an [facility] hospital follow-up appointment after discharge.  The patient does not have transportation limitations that hinder transportation to clinic appointments.  .Services Needed at time of discharge: Y = Yes, Blank = No PT:   OT:   RN:   Equipment:   Other:     LOS: 12 days   Evelena Peat

## 2013-04-29 NOTE — Progress Notes (Signed)
Patient ID: Breanna Guerrero, female   DOB: 1927/11/01, 79 y.o.   MRN: [id number]    Subjective:  Poor appetite going to rehab in am    Objective:  Filed Vitals:   04/27/13 2107 04/28/13 0406 04/28/13 2020 04/29/13 0601  BP: 110/65 124/73 138/65 148/73  Pulse: 73 72 79 79  Temp: 98.1 F (36.7 C) 97.8 F (36.6 C) 97.9 F (36.6 C) 97.8 F (36.6 C)  TempSrc: Oral Oral Oral Oral  Resp: 18 18 18 19   Height:      Weight:      SpO2: 99% 99% 100% 100%    Intake/Output from previous day:  Intake/Output Summary (Last 24 hours) at 04/29/13 0835 Last data filed at 04/29/13 0500  Gross per 24 hour  Intake      6 ml  Output   1475 ml  Net  -1469 ml    Physical Exam: Frail thin female Crackles and rales bilaterally Normal heart sounds Ileostomy Plus 2 PT No edema Neuro non focal  Lab Results: Basic Metabolic Panel:  Recent Labs  40/98/11 0550  NA 133*  K 4.1  CL 100  CO2 24  GLUCOSE 100*  BUN 10  CREATININE 0.86  CALCIUM 8.2*   CBC:  Recent Labs  04/27/13 0550 04/28/13 0530  WBC 5.3  --   NEUTROABS 4.1  --   HGB 6.7* 7.2*  HCT 19.6* 21.3*  MCV 81.7  --   PLT 221  --     Imaging:  Cardiac Studies:  ECG:  NSR no acute ischemic changes   Telemetry:  NSR no fib flutter 04/29/2013   Echo: EF normal mild MR  Medications:   . antiseptic oral rinse  15 mL Mouth Rinse BID  . dronedarone  400 mg Oral BID WC  . feeding supplement (ENSURE COMPLETE)  237 mL Oral BID BM  . pantoprazole (PROTONIX) IV  40 mg Intravenous Q12H  . potassium chloride  40 mEq Oral Daily  . sodium chloride  3 mL Intravenous Q12H     . sodium chloride 10 mL/hr at 04/28/13 2218    Assessment/Plan:  PAF:  NSR cannot tolerate iv amiodarone due to hallucinations although I suspect this has more to do with her overall medical condition Continue  multaq for now.  She is not interested in ILD as recommended by Dr Delman Cheadle to go to rehab with no telemetry.  Plan is To continue multaq  until she is post op from next surgery to take ostomy down   Charlton Haws 04/29/2013, 8:35 AM

## 2013-04-29 NOTE — Discharge Summary (Signed)
Patient Name:  Breanna Guerrero  MRN: [id number]  PCP: Grier Mitts, MD  DOB:  05/31/1928       Date of Admission:  04/17/2013  Date of Discharge:  04/30/2013      Attending Physician: Dr. Debe Coder MD         DISCHARGE DIAGNOSES: 1.   Severe sepsis(995.92) 2.   Volume depletion 3.   Acute kidney injury 4.   Tremulousness 5.   Dysphagia 6.   Anemia, chronic disease 7.   Malnutrition of moderate degree 8.   HCAP (healthcare-associated pneumonia) 9.   Acute pulmonary edema 10.   Acute respiratory failure 11.   A-fib   DISPOSITION AND FOLLOW-UP: Breanna Guerrero is to follow-up with the listed providers as detailed below, at patient's visiting, please address following issues:   Follow-up Information   Follow up with Charlton Haws, MD. (May 14, 2013 at 11:30AM)    Specialty:  Cardiology   Contact information:   [address] [place] [address] [phone]          Discharge Orders   Future Appointments Provider Department Dept Phone   05/14/2013 11:30 AM Kennon Rounds [facility] [place] Office [phone]   06/08/2013 12:00 PM Krista [place] [facility] [phone]   06/08/2013 12:30 PM Ladene Artist, MD [place] [facility] [phone]   Future Orders Complete By Expires   Call MD for:  difficulty breathing, headache or visual disturbances  As directed    Call MD for:  extreme fatigue  As directed    Call MD for:  persistant dizziness or light-headedness  As directed    Call MD for:  persistant nausea and vomiting  As directed    Call MD for:  severe uncontrolled pain  As directed    Call MD for:  temperature >100.4  As directed    Diet - low sodium heart healthy  As directed    Increase activity slowly  As directed        DISCHARGE MEDICATIONS:   Medication List         acetaminophen-codeine 300-15 MG per tablet  Commonly known as:  TYLENOL #2  Take 1 tablet by  mouth every 4 (four) hours as needed for moderate pain.     dronedarone 400 MG tablet  Commonly known as:  MULTAQ  Take 1 tablet (400 mg total) by mouth 2 (two) times daily with a meal.     feeding supplement (ENSURE COMPLETE) Liqd  Take 237 mLs by mouth 2 (two) times daily between meals.     pantoprazole 40 MG tablet  Commonly known as:  PROTONIX  Take 1 tablet (40 mg total) by mouth daily.     potassium chloride SA 20 MEQ tablet  Commonly known as:  K-DUR,KLOR-CON  Take 2 tablets (40 mEq total) by mouth daily.         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  EXAM: CT ABDOMEN AND PELVIS WITHOUT CONTRAST  TECHNIQUE: Multidetector CT imaging of the abdomen and pelvis was performed following the standard protocol without intravenous contrast.  COMPARISON:  CT scan dated 09/04/2012  FINDINGS: There are tiny bilateral pleural effusions with bibasilar atelectasis and marked peribronchial thickening in the  lower lobes.  The unenhanced liver, spleen, pancreas, adrenal glands, and kidneys are normal except for a tiny stone in the upper pole of the right kidney. Ileostomy is noted in the right mid abdomen. There are no dilated loops of bowel. Uterus is normal. Right ovary is normal. Left ovary is not well-defined.  Postsurgical changes in the presacral space. No appreciable abdominal abscesses. Slight anasarca in the subcutaneous fat of the abdomen and pelvis.  The patient has fractures of the right side of the sacrum and of the right side of the symphysis pubis which are new since 09/04/2012 but these are not acute. There are incompletely healed.  IMPRESSION: 1. Incompletely healed fractures of the right side of the symphysis pubis and right sacral ala. 2. Postsurgical changes in lower pelvis. No acute abnormality in the abdomen. 3.  Small bilateral pleural effusions with bibasilar atelectasis and peribronchial thickening. 4. Slight anasarca.   Electronically Signed   By: Geanie Cooley M.D.   On: 04/18/2013 17:04   US Abdomen Complete  04/19/2013   CLINICAL DATA:  Question cholecystitis.  EXAM: ULTRASOUND ABDOMEN COMPLETE  COMPARISON:  CT 04/18/2013  FINDINGS: Gallbladder  6 mm gallstone noted within the fundus of the gallbladder. No wall thickening scratch stab borderline wall thickness at 3.3 mm. No sonographic Murphy's sign. No pericholecystic fluid.  Common bile duct  Diameter: Normal caliber, 4 mm.  Liver  No focal lesion identified. Within normal limits in parenchymal echogenicity.  IVC  No abnormality visualized.  Pancreas  Visualized portion unremarkable.  Spleen  Size and appearance within normal limits.  Right Kidney  Length: 9.6 cm. Echogenicity within normal limits. No mass or hydronephrosis visualized.  Left Kidney  Length: 12.1 cm. Echogenicity within normal limits. No mass or hydronephrosis visualized.  Abdominal aorta  No aneurysm visualized.  IMPRESSION: 6 mm gallstone noted within the fundus of the gallbladder. Gallbladder wall is slightly thickened at 3.3 mm without pericholecystic fluid or sonographic Murphy's sign. This could reflect changes of chronic cholecystitis. If clinical concern for acute cholecystitis persists, nuclear medicine hepatobiliary scan may be helpful.   Electronically Signed   By: Charlett Nose M.D.   On: 04/19/2013 16:18   Dg Chest Port 1 View  04/23/2013   CLINICAL DATA:  79 year old female with acute respiratory failure. Initial encounter. Pneumonia versus ARDS.  EXAM: PORTABLE CHEST - 1 VIEW  COMPARISON:  04/2013 and earlier.  FINDINGS: Continued confluent bilateral upper lobe and infrahilar nodular and interstitial pulmonary opacity, progressed since 04/1913 and new since 04/17/2013. Stable right IJ central line. No pneumothorax. Small bilateral pleural effusions. Stable cardiac size and  mediastinal contours.  IMPRESSION: No significant change in bilateral upper lobe and infrahilar nodular and streaky pulmonary opacity, new since 04/17/2013. Superimposed bilateral pleural effusions. Favor bilateral pneumonia.   Electronically Signed   By: Augusto Gamble M.D.   On: 04/23/2013 07:52   Dg Chest Port 1 View  04/22/2013   CLINICAL DATA:  Short of breath  EXAM: PORTABLE CHEST - 1 VIEW  COMPARISON:  Yesterday  FINDINGS: Mild cardiomegaly. Diffuse edema is worse. Bilateral effusions are worse. No pneumothorax. Stable central venous catheter.  IMPRESSION: Worsening volume overload.   Electronically Signed   By: Maryclare Bean M.D.   On: 04/22/2013 07:33   Dg Chest Port 1 View  04/21/2013   CLINICAL DATA:  Pneumonia.  EXAM: PORTABLE CHEST - 1 VIEW  COMPARISON:  04/20/2013  FINDINGS: Right jugular central venous catheter is present with tip overlying the  lower SVC, unchanged. Cardiomediastinal silhouette is within normal limits. Pulmonary edema has slightly improved from the prior study. There is persistent retrocardiac opacity in the left lung base, which may reflect atelectasis versus infectious infiltrate. Trace bilateral pleural effusions are questioned. No acute osseous abnormality.  IMPRESSION: 1. Slightly improved pulmonary edema. 2. Persistent left basilar opacity, atelectasis versus infection. 3. Query trace bilateral pleural effusions.   Electronically Signed   By: Sebastian Ache   On: 04/21/2013 14:09   Dg Chest Port 1 View  04/20/2013   CLINICAL DATA:  Right jugular line placement  EXAM: PORTABLE CHEST - 1 VIEW  COMPARISON:  Portable exam 1457 hr compared to 0539 hr  FINDINGS: New right jugular central venous catheter with tip projecting over mid SVC.  Normal heart size and mediastinal contours.  Pulmonary vascular congestion.  Increased pulmonary edema since previous exam particularly in the upper lobes.  Small left pleural effusion.  No pneumothorax post line insertion; skin fold noted projecting  over right chest.  Bones demineralized.  IMPRESSION: No pneumothorax following right jugular line insertion.  Increased pulmonary edema.   Electronically Signed   By: Ulyses Southward M.D.   On: 04/20/2013 15:18   Dg Chest Port 1 View  04/20/2013   CLINICAL DATA:  Respiratory failure  EXAM: PORTABLE CHEST - 1 VIEW  COMPARISON:  04/19/2013; 04/18/2013; chest CT -09/04/2012  FINDINGS: Grossly unchanged borderline enlarged cardiac silhouette and mediastinal contours. The pulmonary vasculature remains indistinct with cephalization of flow, worse within the right upper lung. Bibasilar heterogeneous opacities are unchanged, left greater than right. Grossly unchanged trace bilateral effusions. No pneumothorax. Unchanged bones.  IMPRESSION: Grossly unchanged findings of asymmetric pulmonary edema and bibasilar opacities, left greater than right, atelectasis versus infiltrate. A follow-up chest radiograph in 4 to 6 weeks after treatment is recommended to ensure resolution.   Electronically Signed   By: Simonne Come M.D.   On: 04/20/2013 07:53   Dg Chest Port 1 View  04/19/2013   CLINICAL DATA:  Pulmonary edema  EXAM: PORTABLE CHEST - 1 VIEW  COMPARISON:  the previous day's study  FINDINGS: Little interval change in asymmetric interstitial edema or infiltrates with relative sparing in the left upper lung. Small pleural effusions persist. Heart size upper limits normal. Regional bones unremarkable.  IMPRESSION: Stable asymmetric edema or infiltrates with small effusions.   Electronically Signed   By: Oley Balm M.D.   On: 04/19/2013 22:08   Dg Chest Port 1 View  04/18/2013   CLINICAL DATA:  Shortness of breath.  EXAM: PORTABLE CHEST - 1 VIEW  COMPARISON:  Chest radiograph April 17, 2013  FINDINGS: Cardiac silhouette remains upper limits of normal, even with consideration to this portable examination with crowded, centrally engorged vasculature markings. Mediastinal silhouette is nonsuspicious. Interstitial  prominence with mild bibasilar airspace opacity and trace pleural effusions. No pneumothorax though, lung apices are obscured by facial structures.  Osteopenia. Multiple EKG lines overlie the patient and may obscure subtle underlying pathology. The tissue planes are nonsuspicious.  IMPRESSION: Borderline cardiomegaly, mild pulmonary edema with interstitial prominence, mild bibasilar airspace opacities could reflect confluent edema or, less likely early pneumonia with trace pleural effusions.   Electronically Signed   By: Awilda Metro   On: 04/18/2013 22:48   Dg Chest Port 1 View  04/17/2013   CLINICAL DATA:  Found on her floor this afternoon, dehydration, elevated white blood cell count, history of cancer, renal insufficiency and hypertension  EXAM: PORTABLE CHEST - 1 VIEW  COMPARISON:  Chest CT -09/04/2012  FINDINGS: Normal cardiac silhouette and mediastinal contours. The lungs appear hyperexpanded. Minimal bibasilar atelectasis. Trace bilateral effusions not excluded. No evidence of pulmonary edema. No pneumothorax. Osteopenia without definite acute osseus abnormality.  IMPRESSION: Bibasilar atelectasis and possible trace bilateral effusions without definite acute cardiopulmonary disease on this AP portable examination. Further evaluation with a PA and lateral chest radiograph may be obtained as clinically indicated.   Electronically Signed   By: Simonne Come M.D.   On: 04/17/2013 18:04   Dg Swallowing Func-speech Pathology  04/21/2013   Riley Nearing [place]blois, CCC-SLP     04/21/2013 11:08 AM Objective Swallowing Evaluation: Modified Barium Swallowing Study   Patient Details  Name: Breanna Guerrero MRN: [id number] Date of Birth: 02-Dec-1927  Today's Date: 04/21/2013 Time: 1000-1030 SLP Time Calculation (min): 30 min  Past Medical History:  Past Medical History  Diagnosis Date  . Hypertension   . Stroke May 2013    mild stroke  . Hemorrhoids   . Cancer     Rectal adenocarcinoma   Past Surgical History:  Past  Surgical History  Procedure Laterality Date  . Hemorrhoid surgery    . Colonoscopy    . Eus N/A 09/09/2012    Procedure: LOWER ENDOSCOPIC ULTRASOUND (EUS);  Surgeon: Willis Modena, MD;  Location: [facility];  Service: Endoscopy;   Laterality: N/A;  moderate sedation  . Rectal biopsy  09/03/2012    Suspicious Adenocarcinoma   HPI:  Pt found her down on the floor, confused, agitated, weak and  tremulous, apparently having fallen from the couch. She had a  similar episode of tremulousness, weakness and confusion about 4  days prior, this was when she was evaluated in the ED. Further  history includes increased rectal drainage (she is s/p rectal  cancer with diverting look ileostomy on January 07, 2013) along  with increased output from the colostomy (4-5 times per day). The  colostomy output today is loose and foul smelling. Her last  antibiotics were during her admission for cancer at [facility]. Further, during the past week, she has had decreased PO  intake to both solids and liquids due to feeling like she  couldn't swallow, but no pain with swallowing. Her daughter also  reports that she has had a personality change and has been less  pleasant.  Pt suspected to have SIRS.      Assessment / Plan / Recommendation Clinical Impression  Dysphagia Diagnosis: Mild pharyngeal phase dysphagia Clinical impression: Pt demonstrates mild oropharyngeal dysphagia  secondary to discoordintion of swallow/repsiration pattern;  strength of musculature WNL. Oral phase WFL, able to masticate  and transit solids and puree without difficutly. When taking sips  of thin liquids pt had intermittent trace penetration/aspiration  before the swallow in 2 out of 20 trials. When cued to briefly  hold small thin bolus orally and then actively swallow pt did not  penetrate or aspirate. Pushed pt to larger consecutive sips with  nectar thick with no penetration. Given that pt has fluctuating  respiratory function, recommend conservative diet  of Dys 3  (mechanical soft) nectar thick liquids. Pt may have sips of thin  water or tea in between meals following oral care when  respiratory function WNL. Anticipate that as respiratory function  becomes consistent pt can safely upgrade to regular diet with  thin liquids. Etiology of acute dysphagia suspected to be high  respiratory rate and anxiety; chronic dysphagia resluting in an  aspiration pna PTA  unlikely. There may also be a component of  esophageal dysphagia, sweep showed apperance of spasm, POs  surging to cervical esophagus without pt awareness.     Treatment Recommendation  Therapy as outlined in treatment plan below    Diet Recommendation Dysphagia 3 (Mechanical Soft);Nectar-thick  liquid   Liquid Administration via: Cup;Straw Medication Administration: Whole meds with puree Supervision: Patient able to self feed;Full supervision/cueing  for compensatory strategies Compensations: Slow rate;Small sips/bites Postural Changes and/or Swallow Maneuvers: Seated upright 90  degrees;Out of bed for meals    Other  Recommendations Recommended Consults: MBS Oral Care Recommendations: Oral care before and after PO Other Recommendations: Order thickener from pharmacy   Follow Up Recommendations  None    Frequency and Duration min 2x/week  2 weeks   Pertinent Vitals/Pain NA    SLP Swallow Goals     General HPI: Pt found her down on the floor, confused, agitated,  weak and tremulous, apparently having fallen from the couch. She  had a similar episode of tremulousness, weakness and confusion  about 4 days prior, this was when she was evaluated in the ED.  Further history includes increased rectal drainage (she is s/p  rectal cancer with diverting look ileostomy on January 07, 2013)  along with increased output from the colostomy (4-5 times per  day). The colostomy output today is loose and foul smelling. Her  last antibiotics were during her admission for cancer at [facility]. Further, during the past week, she  has had decreased PO  intake to both solids and liquids due to feeling like she  couldn't swallow, but no pain with swallowing. Her daughter also  reports that she has had a personality change and has been less  pleasant.  Pt suspected to have SIRS.  Type of Study: Modified Barium Swallowing Study Reason for Referral: Objectively evaluate swallowing function Previous Swallow Assessment: none Diet Prior to this Study: NPO Temperature Spikes Noted: Yes Respiratory Status: Nasal cannula History of Recent Intubation: No Behavior/Cognition: Alert;Cooperative;Pleasant mood (anxious) Oral Cavity - Dentition: Adequate natural dentition Oral Motor / Sensory Function: Within functional limits Self-Feeding Abilities: Able to feed self Patient Positioning: Upright in chair Baseline Vocal Quality: Clear Volitional Cough: Strong Volitional Swallow: Able to elicit Anatomy: Within functional limits Pharyngeal Secretions: Not observed secondary MBS    Reason for Referral Objectively evaluate swallowing function   Oral Phase Oral Preparation/Oral Phase Oral Phase: WFL   Pharyngeal Phase Pharyngeal Phase Pharyngeal Phase: Impaired Pharyngeal - Nectar Pharyngeal - Nectar Cup: Within functional limits Pharyngeal - Thin Pharyngeal - Thin Cup: Delayed swallow  initiation;Penetration/Aspiration before swallow;Trace aspiration Penetration/Aspiration details (thin cup): Material does not  enter airway;Material enters airway, passes BELOW cords without  attempt by patient to eject out (silent aspiration);Material  enters airway, CONTACTS cords and not ejected out Pharyngeal - Thin Straw: Within functional limits (with cues to  orally hold bolus, small sips) Pharyngeal - Solids Pharyngeal - Puree: Within functional limits Pharyngeal - Regular: Within functional limits Pharyngeal - Pill: Within functional limits  Cervical Esophageal Phase    GO    Cervical Esophageal Phase Cervical Esophageal Phase: [facility]        Harlon Ditty, MA CCC-SLP  [phone]  DeBlois, Riley Nearing 04/21/2013, 11:06 AM        ADMISSION DATA: H&P: SEAIRA BYUS is a 79 y.o. female PMH rectal cancer s/p neoadjuvant chemotherapy, radiation, and low anterior resection and diverting loop ileostomy on 01/07/13, hypertension who presents as a transfer from  [facility] with a chief complaint of altered mental status. Her son-in-law Brett Canales was with her and helped provide the history. He is a radiologist here at [facility] and requested she be brought here for care.  Patient was taken to [facility] by ambulance this afternoon after being found on the floor of her house confused, agitated, weak, and tremulous. Her family had called 911 on her behalf because she was not picking up her telephone. Patient apparently "slid off the couch" and could not reach her phone, which was only about 3 feet away. Currently she reports chills/shivers but denies fever, chest pain, cough, abdominal pain, nausea, vomiting, increased ostomy output, dysuria. She says she feels short of breath only when she shivers, and also that her throat is dry.  Son-in-law reports that 4 nights prior to today, she had a similar episode of tremulousness, weakness, and confusion and was brought to the [facility] ED. The doctors there said she was dehydrated, gave her fluids, and sent her home. At that time her son-in-law felt her symptoms might have been at least somewhat anxiety driven; her husband is recovering in the hospital after a ruptured appendix and sepsis, and she has been concerned about him coming home and having to take care for him. They live alone in [place].  Son-in-law also reports that the patient has had decreased po intake for the past 5 weeks. Her appetite is normal, but she gets full after only a few bites and sometimes complains of dysphagia symptoms. Otherwise he has not noted significant changes in her symptoms or behavior.  Regarding her history of rectal cancer, Ms. Babinski is  followed by Dr. Truett Perna. She is s/p low anterior resection and diverting loop ileostomy on 01/07/13 at [facility]. Her hospital course was apparently complicated by atrial fibrillation and acute respiratory failure. 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 Since discharge she has been living at home and successfully completed a course of home PT. She elected not to pursue adjuvant chemotherapy given successful surgery, and instead to take an observational approach. Post-op CEAs have been within normal limits. Her ileostomy reversal was scheduled to happen in the next few months.  She denies alcohol, tobacco, or drug use. She denies using benzodiazepines. Her PCP is Dr. Francine Graven.  Home meds include metoprolol prn hypertension, which she has not required since her surgery per the son-in-law.  At [facility] she was noted to be tachycardic and dehydrated on arrival, with a WBC of 22. She was given 3L of IV fluids, vancomycin, Zosyn, and levofloxacin, Cardizem for supposed atrial flutter (but no 12-lead EKG was performed).  Physical Exam: Blood pressure 128/53, pulse 115, temperature 97.8 F (36.6 C), temperature source Oral, resp. rate 25, height 5\' 2"  (1.575 m), weight 122 lb 5.7 oz (55.5 kg), SpO2 95.00%.  Physical Exam  Constitutional: She appears dehydrated. She appears distressed.  Appears unkempt. Agitated at having to answer questions.  HENT:  Head: Normocephalic and atraumatic.  Mouth/Throat: Uvula is midline. Mucous membranes are dry.  Neck: Normal range of motion. Neck supple.  Cardiovascular: Regular rhythm and normal heart sounds. Tachycardia present. Exam reveals no gallop and no friction rub.  No murmur heard.  Pulmonary/Chest: Breath sounds normal. She has no wheezes. She has no rales.  Pursing lips with respiration. RR 24.  Abdominal: Soft. She exhibits no distension and no mass.  There is no tenderness. There is no rebound and no guarding.  Bowel sounds somewhat hyperactive. Ostomy on right. Site is pink and non-tender. Bag is full of light brown/green, loose output. Slight foul smell.  Musculoskeletal: Normal range of motion. She exhibits no edema and no tenderness.  Lymphadenopathy:  She has no cervical adenopathy.  Neurological: She is alert. No cranial nerve deficit.  Oriented x3 but with delayed responses (took a minute to tell us her full name).  Skin: Skin is warm and dry. No rash noted.  Decreased skin turgor. No evidence of rash or cellulitis on back, legs.  Psychiatric:  Agitated.   Labs: At [facility] 04/17/13 @1201 :   CMP:  Na 126 (baseline ~130 per [facility] records), K 5.6, Cl 94, CO2 15, AG 17, BUN 61, Cr 3.7 (baseline ~0.7-0.9 per [facility] records), GFR 12.6, Glu 132, AST 23, ALT 41, Total bili 1.1, Ca 8.6, Alk phos 250, total protein 6.3, albumin 2.4   CBC with diff:  WBC 22.3, HGB 9.9 (baseline ~10-11), HCT 29, MCV 82, Neut% 98.6, Neut # 21.9, bandemia (31%)   Lactic acid = 2.8   CK = 20   Troponin = 0.03   UA is clean, shows trace ketones, no nitrites or LE, 0-2 WBCs, no bacteria seen  HOSPITAL COURSE: Septic shock, resolved: Ms. Grindle was transfered from [facility] on 11/15.  Based on labs done at [facility] she met criteria for severe sepsis (bandemia 30%, leukocytosis 22 and acute renal failure) without clear source.  She also had an elevated anion gap, 17 with a elevated lactate, 2.8.  Her BP remained low despite several fluid boluses and she was transferred to SDU on hospital day 2 for closer monitoring.  She received 1 unit PRBC on hospital day 2 for a declining Hgb.  1-1.5 hours into the transfusion the patient became hypoxic and BiPAP was placed.  Overnight she developed afib with rvr, 140s-150s and had increased work of breathing so she was transferred to the ICU.  She received IV fluids for BP management and BiPAP for respiratory distress.   Her metabolic acidosis resolved with bicarb drip in the ICU.  Her AKI also resolved.  CXR on 11/21 was concerning for HCAP and this is a possible source of her sepsis.  Blood and urine cultures were negative.  Her white count began to trend down on vancomycin and zosyn.  She was stabilized and transferred from ICU back to IMTS on 04/23/13.  She initially required 5L on the floors but was able to wean off.  The patient was discharged in stable condition to SNF on 04/30/13.  Paroxysmal atrial fibrillation: She developed Afib with rvr on hospital day 3, likely sepsis related.  She was started on amiodarone but had visual hallucinations so dronedarone was started.  She remained in NSR after transfer back to the floors.  The patient declined loop recorder.  The plan per cardiology wss to continue dronedarone until ileostomy reversal surgery (planned for January 2015).  She has hospital follow-up with [facility] on 05/14/13.  Thrombocytopenia, resolved: Possibly due to severe sepsis.  She had no neurological symptoms and resolution of renal failure making TTP less likely.  She had a negative HIT Ab and DIC panel was negative.   Microcytic Anemia, improving:  On admission was 7.8 without evidence of bleeding , received 1 PRBC (04/18/13) however with possible transfusion reaction (possible TRALI).  The patient was resistant to further transfusions and we agreed to monitor and reassess the need for further transfusion on a daily basis.  We reduced the number  of lab draws and her Hgb improved from 6.3---> 8.0 without additional blood transfusion.  She had weakness but was able to walk with PT and denied dyspnea or CP.  Hypokalemia, resolved  Most likely due to malnutrition and bowel resection.  KCl 40 mEq daily was continued at discharge.   Hypoalbuminemia -  Most likely due to malnutrition.  RD recommeneded Ensure between meals which was continued at discharge  DISCHARGE DATA: Vital Signs: BP 133/56  Pulse  81  Temp(Src) 98.2 F (36.8 C) (Oral)  Resp 18  Ht 5\' 2"  (1.575 m)  Wt 52.527 kg (115 lb 12.8 oz)  BMI 21.17 kg/m2  SpO2 99%  Labs: Results for orders placed during the hospital encounter of 04/17/13 (from the past 24 hour(s))  HEMOGLOBIN AND HEMATOCRIT, BLOOD     Status: Abnormal   Collection Time    04/29/13  5:00 PM      Result Value Range   Hemoglobin 8.0 (*) 12.0 - 15.0 g/dL   HCT 40.9 (*) 81.1 - 91.4 %     Services Ordered on Discharge: Y = Yes; Blank = No PT:   OT:   RN:   Equipment:   Other:      Time Spent on Discharge: 35 min   Signed: Evelena Peat PGY 1, Internal Medicine Resident 04/30/2013, 2:06 PM

## 2013-04-30 MED ORDER — DRONEDARONE HCL 400 MG PO TABS: 400.0000 mg | ORAL_TABLET | Freq: Two times a day (BID) | ORAL | Status: DC

## 2013-04-30 MED ORDER — PANTOPRAZOLE SODIUM 40 MG PO TBEC: 40.0000 mg | DELAYED_RELEASE_TABLET | Freq: Every day | ORAL | Status: DC

## 2013-04-30 MED ORDER — POTASSIUM CHLORIDE CRYS ER 20 MEQ PO TBCR: 40.0000 meq | EXTENDED_RELEASE_TABLET | Freq: Every day | ORAL | Status: DC

## 2013-04-30 MED ORDER — ENSURE COMPLETE PO LIQD: 237.0000 mL | Freq: Two times a day (BID) | ORAL | Status: AC

## 2013-04-30 NOTE — Progress Notes (Signed)
Report called to Kim at [facility]. 

## 2013-04-30 NOTE — Progress Notes (Signed)
Clinical social worker assisted with patient discharge to skilled nursing facility, [facility].  CSW addressed all family questions and concerns. CSW copied chart and added all important documents. CSW also set up patient transportation with [facility]. Clinical Social Worker will sign off for now as social work intervention is no longer needed.   Shatoya Roets, MSW, LCSWA [phone] 

## 2013-04-30 NOTE — Progress Notes (Signed)
Physical Therapy Treatment Patient Details Name: Breanna Guerrero MRN: [id number] DOB: 1927-07-14 Today's Date: 04/30/2013 Time: 9147-8295 PT Time Calculation (min): 30 min  PT Assessment / Plan / Recommendation  History of Present Illness Pt is a 79 y.o. female adm secondary to fall and dehydration. Found to have 2 remote fractures by CT 04/18/2013 ( Incompletely healed fractures of the right side of the symphysis pubis and right sacral ala)   PT Comments   Patient able to walk better with walker, but seems limited on tolerance to distances due to SOB no off O2, though SpO2 WNL.  Will benefit from SNF level rehab for slow progression of mobility and activity as pt feels she overdid it last time with HHPT.  Follow Up Recommendations  SNF     Does the patient have the potential to tolerate intense rehabilitation   N/A  Barriers to Discharge  None      Equipment Recommendations  Rolling walker with 5" wheels    Recommendations for Other Services  None  Frequency Min 2X/week   Progress towards PT Goals Progress towards PT goals: Progressing toward goals  Plan Current plan remains appropriate    Precautions / Restrictions Precautions Precautions: Fall   Pertinent Vitals/Pain Left hip pain with ambulation, not rated    Mobility  Bed Mobility Bed Mobility: Not assessed Details for Bed Mobility Assistance: To in straight back chair with daughter in room Transfers Sit to Stand: From chair/3-in-1;5: Supervision;4: Min guard Stand to Sit: To chair/3-in-1;4: Min guard Details for Transfer Assistance: assist for safety with pt fatigued after walking Ambulation/Gait Ambulation/Gait Assistance: 4: Min assist;4: Min guard Ambulation Distance (Feet): 60 Feet (x 2) Assistive device: 1 person hand held assist;Rolling walker Ambulation/Gait Assistance Details: Initial walk halfway with one hand hold assist, then with walker with improved independence and safety due to incr SOB and  weakness Gait Pattern: Decreased stride length;Step-through pattern;Narrow base of support    Exercises Total Joint Exercises Ankle Circles/Pumps: AROM;Both;5 reps;Seated Long Arc Quad: AROM;Both;5 reps;Seated    PT Goals (current goals can now be found in the care plan section)    Visit Information  Last PT Received On: 04/30/13 Assistance Needed: +1 History of Present Illness: Pt is a 79 y.o. female adm secondary to fall and dehydration. Found to have 2 remote fractures by CT 04/18/2013 ( Incompletely healed fractures of the right side of the symphysis pubis and right sacral ala)    Subjective Data   Is that enough for one day?   Cognition  Cognition Arousal/Alertness: Awake/alert Behavior During Therapy: WFL for tasks assessed/performed Overall Cognitive Status: Within Functional Limits for tasks assessed    Balance     End of Session PT - End of Session Equipment Utilized During Treatment: Gait belt Activity Tolerance: Patient limited by fatigue Patient left: in chair;with family/visitor present;with call bell/phone within reach   GP     [facility] 04/30/2013, 11:47 AM Sheran Lawless, PT [phone] 04/30/2013

## 2013-04-30 NOTE — Progress Notes (Signed)
Subjective: Breanna Guerrero was seen and examined this morning.  She was sitting up in bed and her daughter was present.  She says she feels fine and denies SOB.  She is ready for discharge to SNF today.   Objective: Vital signs in last 24 hours: Filed Vitals:   04/29/13 0601 04/29/13 1337 04/29/13 2100 04/30/13 0500  BP: 148/73 106/51 106/47 133/56  Pulse: 79 79 71 81  Temp: 97.8 F (36.6 C) 97.9 F (36.6 C) 98.2 F (36.8 C) 98.2 F (36.8 C)  TempSrc: Oral     Resp: 19 18 18 18   Height:      Weight:    52.527 kg (115 lb 12.8 oz)  SpO2: 100% 100% 97% 99%   Weight change:   Intake/Output Summary (Last 24 hours) at 04/30/13 0926 Last data filed at 04/30/13 0500  Gross per 24 hour  Intake    363 ml  Output   1850 ml  Net  -1487 ml   General: in bed in NAD; looks much improved Neuro: Awake and alert, mentation is appropriate Lungs: CTA B/L   Heart: NSR, normal rate, no murmur  AB: bs+, soft, nontender, nondistended Ext: +1  LE edema B/L  Lab Results: Basic Metabolic Panel:  Recent Labs Lab 04/25/13 0520 04/26/13 0600 04/27/13 0550  NA 139 135 133*  K 3.9 4.0 4.1  CL 106 101 100  CO2 27 25 24   GLUCOSE 111* 131* 100*  BUN 14 11 10   CREATININE 0.90 0.83 0.86  CALCIUM 8.2* 8.2* 8.2*  MG 1.5  --   --    CBC:  Recent Labs Lab 04/26/13 0600 04/27/13 0550 04/28/13 0530 04/29/13 1700  WBC 8.2 5.3  --   --   NEUTROABS 7.0 4.1  --   --   HGB 7.1* 6.7* 7.2* 8.0*  HCT 21.0* 19.6* 21.3* 23.3*  MCV 83.0 81.7  --   --   PLT 188 221  --   --    Anemia Panel:  Recent Labs Lab 04/25/13 0520  VITAMINB12 1822*  FOLATE 9.2  FERRITIN 610*  TIBC 198*  IRON 52  RETICCTPCT 2.9   Studies/Results: No results found. Medications: I have reviewed the patient's current medications. Scheduled Meds: . antiseptic oral rinse  15 mL Mouth Rinse BID  . dronedarone  400 mg Oral BID WC  . feeding supplement (ENSURE COMPLETE)  237 mL Oral BID BM  . pantoprazole  40 mg Oral  Daily  . potassium chloride  40 mEq Oral Daily  . sodium chloride  3 mL Intravenous Q12H   Continuous Infusions: . sodium chloride 10 mL/hr at 04/28/13 2218   PRN Meds:.ondansetron (ZOFRAN) IV, RESOURCE THICKENUP CLEAR, sodium chloride  Assessment/Plan: Breanna Guerrero is a 79 y.o. female PMH rectal cancer s/p neoadjuvant chemotherapy, radiation, and low anterior resection and diverting loop ileostomy on 01/07/13, hypertension who presented as a transfer from [facility] with a chief complaint of altered mental status.    Septic shock, resolved  - Most likely due to HCAP PNA. Pt with last CXR on 11/21 with possible bilateral PNA. Transferred from ICU back to IMTS on 04/23/13.  Leukocytosis improving. Blood cultures NGTD and procalcitonin down trending.  Has received  IV zosyn (day 10) and vancomycin (day 5) for HCAP.  MRSA negative.  Vanc and Zosyn completed 04/17/13.  Tolerated oxygen wean and now off oxygen. -SW has been of great assistance; patient set for d/c to [facility] today.  Paroxysmal atrial  fibrillation - currently in NRS.  Patient declined loop recorder.  Continue dronedarone until after ileostomy reversal surgery.  -Continue dronedarone -Discontinued digoxin due to potential interaction with dronederone   Thrombocytopenia, resolved - Possibly due to severe sepsis. Also concerning for TTP due to presence of schistocytes on blood smear,  renal failure & neurological changes on admission, and elevated LDH with normal PT/PTT.  Pt with negative HIT Ab. DIC panel was negative. Currently without neurological symptoms and has had resolution of renal failure. -Monitor for bleeding  Microcytic Anemia, improving - 6.7--> 8.0 without intervention; on admission was 7.8; received 1 PRBC (04/18/13) however with transfusion reaction (possible TRALI).  Pt has weakness but was able to walk with PT and denies SOB.  Fe studies do not suggests Fe-deficiency.  No signs of active bleeding.  Her anemia  may have been due to a mild hemolysis during her acute sepsis and delayed bone marrow response to replace cells.  Hypokalemia, resolved -  most likely due to malnutrition  -Continue daily PO KCl 40 mEq at discharge  Hypoalbuminemia - Last albumin 1.8, most likely due to malnutrition -Dysphagia 3 diet  -Ensure supplements  GERD- stable -po protonix  Dispo: [name] has greatly improved and is stable for discharge to SNF today.  The patient does have a current PCP (Breanna Effie Shy III, MD) and does need an [facility] hospital follow-up appointment after discharge.  The patient does not have transportation limitations that hinder transportation to clinic appointments.  .Services Needed at time of discharge: Y = Yes, Blank = No PT:   OT:   RN:   Equipment:   Other:     LOS: 13 days   Evelena Peat

## 2013-05-03 ENCOUNTER — Other Ambulatory Visit: Payer: Self-pay | Admitting: *Deleted

## 2013-05-03 ENCOUNTER — Non-Acute Institutional Stay (SKILLED_NURSING_FACILITY): Payer: Medicare Other | Admitting: Adult Health

## 2013-05-03 DIAGNOSIS — I4891 Unspecified atrial fibrillation: Secondary | ICD-10-CM

## 2013-05-03 DIAGNOSIS — C20 Malignant neoplasm of rectum: Secondary | ICD-10-CM

## 2013-05-03 DIAGNOSIS — E876 Hypokalemia: Secondary | ICD-10-CM

## 2013-05-03 DIAGNOSIS — K219 Gastro-esophageal reflux disease without esophagitis: Secondary | ICD-10-CM

## 2013-05-03 MED ORDER — ACETAMINOPHEN-CODEINE #2 300-15 MG PO TABS: 1.0000 | ORAL_TABLET | ORAL | Status: AC | PRN

## 2013-05-05 ENCOUNTER — Non-Acute Institutional Stay (SKILLED_NURSING_FACILITY): Payer: Medicare Other | Admitting: Internal Medicine

## 2013-05-05 DIAGNOSIS — K219 Gastro-esophageal reflux disease without esophagitis: Secondary | ICD-10-CM

## 2013-05-05 DIAGNOSIS — E43 Unspecified severe protein-calorie malnutrition: Secondary | ICD-10-CM

## 2013-05-05 DIAGNOSIS — I4891 Unspecified atrial fibrillation: Secondary | ICD-10-CM

## 2013-05-05 DIAGNOSIS — D638 Anemia in other chronic diseases classified elsewhere: Secondary | ICD-10-CM

## 2013-05-05 NOTE — Discharge Summary (Signed)
I assisted with the discharge planning for Breanna Guerrero.

## 2013-05-13 ENCOUNTER — Non-Acute Institutional Stay (SKILLED_NURSING_FACILITY): Payer: Medicare Other | Admitting: Adult Health

## 2013-05-13 DIAGNOSIS — E871 Hypo-osmolality and hyponatremia: Secondary | ICD-10-CM

## 2013-05-14 ENCOUNTER — Encounter: Payer: Medicare Other | Admitting: Physician Assistant

## 2013-05-17 ENCOUNTER — Telehealth: Payer: Self-pay | Admitting: Cardiovascular Disease

## 2013-05-17 NOTE — Telephone Encounter (Signed)
New message    on multaq 400mg  bid--heart rate is 50; bp 102/58----the nurse at the rehab ctr want to know if this medication needs to be adjusted.  Pt was seen in hosp by Dr Eden Emms.

## 2013-05-17 NOTE — Telephone Encounter (Signed)
PT'S DAUGHTER  AWARE OF  DR Fabio Bering RESPONSE   WHILE  SPEAKING   THE  PT IS  ALSO  COMPLAINING WITH DULL  CHEST PAIN  WILL FORWARD TO DR Eden Emms FOR  REVIEW

## 2013-05-17 NOTE — Telephone Encounter (Signed)
multaq is an antiarrhythmic it doesn't slow HR

## 2013-05-18 ENCOUNTER — Encounter: Payer: Self-pay | Admitting: Physician Assistant

## 2013-05-18 NOTE — Telephone Encounter (Signed)
She has multiple medical problems and cancer should f/u with her primary and oncology Not a candidate for invasive evaluation

## 2013-05-19 NOTE — Telephone Encounter (Signed)
PT'S  DAUGHTER  AWARE ./CY 

## 2013-05-21 ENCOUNTER — Non-Acute Institutional Stay (SKILLED_NURSING_FACILITY): Payer: Medicare Other | Admitting: Adult Health

## 2013-05-21 DIAGNOSIS — K219 Gastro-esophageal reflux disease without esophagitis: Secondary | ICD-10-CM

## 2013-05-21 DIAGNOSIS — C20 Malignant neoplasm of rectum: Secondary | ICD-10-CM

## 2013-05-21 DIAGNOSIS — I4891 Unspecified atrial fibrillation: Secondary | ICD-10-CM

## 2013-05-21 DIAGNOSIS — E871 Hypo-osmolality and hyponatremia: Secondary | ICD-10-CM

## 2013-05-21 DIAGNOSIS — E876 Hypokalemia: Secondary | ICD-10-CM | POA: Insufficient documentation

## 2013-05-21 NOTE — Progress Notes (Signed)
Patient ID: Breanna Guerrero, female   DOB: Jul 13, 1927, 78 y.o.   MRN: [id number]                         PROGRESS NOTE  DATE: 05/03/2013  FACILITY: Nursing Home Location: [facility]  LEVEL OF CARE: SNF (31)  Acute Visit  CHIEF COMPLAINT:  Follow-up hospitalization  HISTORY OF PRESENT ILLNESS: This is an 79 year old female who has been admitted to [facility] on 04/30/13 from [facility] wherein she was treated for Septic Shock which is most likely due to pneumonia - now resolved. She was transferred to [facility] from [facility] in [place] where she lives. She has been admitted for a short-term rehabilitation.  REASSESSMENT OF ONGOING PROBLEM(S):  ATRIAL FIBRILLATION: the patients atrial fibrillation remains stable.  The patient denies DOE, tachycardia, orthopnea, transient neurological sx, pedal edema, palpitations, & PNDs.  No complications noted from the medications currently being used.  HYPOKALEMIA: The patient's hypokalemia remains stable. Patient denies muscle cramping or palpitations. No complications reported from current potassium supplementation.ANEMIA: The anemia has been stable. The patient denies fatigue, melena or hematochezia. No complications from the medications currently being used.  ANEMIA: The anemia has been stable. The patient denies fatigue, melena or hematochezia. No complications from the medications currently being used. 11/14 hgb 8.0  PAST MEDICAL HISTORY : Reviewed.  No changes.  CURRENT MEDICATIONS: Reviewed per [facility]  REVIEW OF SYSTEMS:  GENERAL: no change in appetite, no fatigue, no weight changes, no fever, chills or weakness RESPIRATORY: no cough, SOB, DOE, wheezing, hemoptysis CARDIAC: no chest pain, edema or palpitations GI: no abdominal pain, diarrhea, constipation, heart burn, nausea or vomiting  PHYSICAL EXAMINATION  VS:  T97.8       P73      RR18      BP111/60     POX98 %     WT109.4 (Lb)  GENERAL: no  acute distress, normal body habitus EYES: conjunctivae normal, sclerae normal, normal eye lids NECK: supple, trachea midline, no neck masses, no thyroid tenderness, no thyromegaly LYMPHATICS: no LAN in the neck, no supraclavicular LAN RESPIRATORY: breathing is even & unlabored, BS CTAB CARDIAC: RRR, no murmur,no extra heart sounds, no edema GI: abdomen soft, normal BS, no masses, no tenderness, no hepatomegaly, no splenomegaly Has ilelostomy PSYCHIATRIC: the patient is alert & oriented to person, affect & behavior appropriate  LABS/RADIOLOGY: 04/29/13 hemoglobin 8.0 hematocrit 22.3 04/27/13 sodium 133 potassium 4.1 glucose 100 BUN 10 creatinine 0.86 calcium 8 point   ASSESSMENT/PLAN:  Septic shock most likely due to pneumonia - resolved  Atrial fibrillation - rate controlled; continue Multaq  Hypokalemia - continue supplementation  Rectal cancer status post low anterior resection and diverting loop ileostomy - follow-up with Dr. Myrle Sheng - oncollogist  GERD - continue Pantoprazole   CPT CODE: 78295

## 2013-05-25 DIAGNOSIS — E871 Hypo-osmolality and hyponatremia: Secondary | ICD-10-CM | POA: Insufficient documentation

## 2013-05-25 NOTE — Progress Notes (Signed)
Patient ID: Breanna Guerrero, female   DOB: 04-Mar-1928, 79 y.o.   MRN: [id number]                PROGRESS NOTE  DATE: 05/21/2013   FACILITY: [facility]  LEVEL OF CARE: SNF (31)  Acute Visit  CHIEF COMPLAINT: Discharge Notes  HISTORY OF PRESENT ILLNESS: This is an 79 year old female who is for discharge home with Home health PT, OT and Nursing. She has been admitted to [facility] on 04/30/13 from [facility] wherein she was treated for Septic Shock which is most likely due to pneumonia - now resolved. She was transferred to [facility] from [facility] in [place] where she lives.  Patient was admitted to this facility for short-term rehabilitation after the patient's recent hospitalization.  Patient has completed SNF rehabilitation and therapy has cleared the patient for discharge.  Reassessment of ongoing problem(s):  ATRIAL FIBRILLATION: the patients atrial fibrillation remains stable.  The patient denies DOE, tachycardia, orthopnea, transient neurological sx, pedal edema, palpitations, & PNDs.  No complications noted from the medications currently being used.  GERD: pt's GERD is stable.  Denies ongoing heartburn, abd. Pain, nausea or vomiting.  Currently on a PPI & tolerates it without any adverse reactions.   PAST MEDICAL HISTORY : Reviewed.  No changes.  CURRENT MEDICATIONS: Reviewed per [facility]  REVIEW OF SYSTEMS:  GENERAL: no change in appetite, no fatigue, no weight changes, no fever, chills or weakness RESPIRATORY: no cough, SOB, DOE, wheezing, hemoptysis CARDIAC: no chest pain, edema or palpitations GI: no abdominal pain, diarrhea, constipation, heart burn, nausea or vomiting  PHYSICAL EXAMINATION  VS:  T98.2       P64       RR18      BP113/54      POX 98%       W114T (Lb)  GENERAL: no acute distress, normal body habitus NECK: supple, trachea midline, no neck masses, no thyroid tenderness, no thyromegaly RESPIRATORY: breathing is even  & unlabored, BS CTAB CARDIAC: RRR, no murmur,no extra heart sounds, no edema GI: abdomen soft, normal BS, no masses, no tenderness, no hepatomegaly, no splenomegaly PSYCHIATRIC: the patient is alert & oriented to person, affect & behavior appropriate  LABS/RADIOLOGY: 05/20/13 sodium 1 date I should point to glucose 126 BUN 13 creatinine 0.8 calcium 9.0 05/14/13 sodium 123 potassium 4.9 glucose 84 BUN 13 creatinine 0.9 calcium 8.4 WBC 5.5 hemoglobin 8.8 hematocrit 8.0 05/13/19 sodium 116 potassium 4.4 glucose 93 BUN 22 creatinine 1.1 calcium 8.6 05/06/13 WBC 4.7 hemoglobin 8.3 hematocrit 26.7 so to 123 potassium 5.4 glucose 139 BUN 17 creatinine 1.0 calcium 8.5 04/29/13 hemoglobin 8.0 hematocrit 22.3 04/27/13 sodium 133 potassium 4.1 glucose 100 BUN 10 creatinine 0.86 calcium 8.6     ASSESSMENT/PLAN:  Atrial fibrillation - rate controlled; continue Multaq  Rectal cancer status post low anterior resection and diverting loop ileostomy - follow-up with Dr. Myrle Sheng - oncollogist  GERD - continue Pantoprazole  Hyponatremia -  Will follow-up with PCP   I have filled out patient's discharge paperwork and written prescriptions.  Patient will receive home health PT, OT and Nursing.   Total discharge time: Less than 30 minutes Discharge time involved coordination of the discharge process with Child psychotherapist, nursing staff and therapy department. Medical justification for home health services verified.  CPT CODE: 62130

## 2013-05-25 NOTE — Progress Notes (Signed)
Patient ID: Breanna Guerrero, female   DOB: 1927-08-05, 79 y.o.   MRN: [id number]       PROGRESS NOTE  DATE: 05/13/2013  FACILITY:  [facility]  LEVEL OF CARE: SNF (31)  Acute Visit  CHIEF COMPLAINT:  Manage Hyponatremia  HISTORY OF PRESENT ILLNESS: This is an 79 year old female who has NA 116. Patient is alert and oriented. Skin noted to be dry. She was noted to have increased ileostomy output.  PAST MEDICAL HISTORY : Reviewed.  No changes.  CURRENT MEDICATIONS: Reviewed per [facility]  REVIEW OF SYSTEMS:  GENERAL: no change in appetite, no fatigue, no weight changes, no fever, chills or weakness RESPIRATORY: no cough, SOB, DOE,, wheezing, hemoptysis CARDIAC: no chest pain, edema or palpitations GI: no abdominal pain, heart burn, nausea or vomiting  PHYSICAL EXAMINATION  VS:  T 98.1       P77       RR18       BP113/59      POX98 %       WT108.8 (Lb)  GENERAL: no acute distress, normal body habitus EYES: conjunctivae normal, sclerae normal, normal eye lids NECK: supple, trachea midline, no neck masses, no thyroid tenderness, no thyromegaly LYMPHATICS: no LAN in the neck, no supraclavicular LAN RESPIRATORY: breathing is even & unlabored, BS CTAB CARDIAC: RRR, no murmur,no extra heart sounds, no edema GI: abdomen soft, normal BS, no masses, no tenderness, no hepatomegaly, no splenomegaly, + ileostomy PSYCHIATRIC: the patient is alert & oriented to person, affect & behavior appropriate  LABS/RADIOLOGY: 05/13/19 sodium 116 potassium 4.4 glucose 93 BUN 22 creatinine 1.1 calcium 8.6 05/06/13 WBC 4.7 hemoglobin 8.3 hematocrit 26.7 so to 123 potassium 5.4 glucose 139 BUN 17 creatinine 1.0 calcium 8.5 04/29/13 hemoglobin 8.0 hematocrit 22.3 04/27/13 sodium 133 potassium 4.1 glucose 100 BUN 10 creatinine 0.86 calcium 8.6    ASSESSMENT/PLAN:  Hyponatremia  - start 0.9 NS @ 70cc X1L then CBC and BMP in AM  CPT CODE: 40981

## 2013-06-02 ENCOUNTER — Other Ambulatory Visit: Payer: Self-pay | Admitting: *Deleted

## 2013-06-02 ENCOUNTER — Telehealth: Payer: Self-pay | Admitting: *Deleted

## 2013-06-02 NOTE — Telephone Encounter (Signed)
Received phone call from patient's daughter, [name], requesting to re-schedule appointment with Dr. Truett Perna to two weeks out from 06/08/13 appointment.  She stated her mother had been in the hospital and in rehab and had a conflicting medical appointment for Jan. 6th.  She requested moving appointment out 2 weeks.  This RN thanked her for calling and will notify Dr. Truett Perna as he is booked for most of January.  This RN told patient's daughter to expect a call after Jan. 6th with new appointment.  She verbalized understanding.

## 2013-06-04 ENCOUNTER — Telehealth: Payer: Self-pay | Admitting: Oncology

## 2013-06-04 NOTE — Telephone Encounter (Signed)
, °

## 2013-06-08 ENCOUNTER — Ambulatory Visit: Payer: Medicare Other | Admitting: Oncology

## 2013-06-08 ENCOUNTER — Other Ambulatory Visit: Payer: Medicare Other

## 2013-06-22 ENCOUNTER — Telehealth: Payer: Self-pay | Admitting: *Deleted

## 2013-06-22 NOTE — Telephone Encounter (Signed)
Received call from patient's daughter, Edrick Oh, stating her mother was having colostomy reversal surgery at [facility] on 07/07/13.  She would like to cancel the appointment on 06/24/13 with Dr. Benay Spice until after the surgery is over.  She stated her mother was doing well and not having any problems for Dr. Benay Spice to address at this time.This RN thanked her for calling and will inform Dr. Benay Spice.  She will receive another follow-up appointment with Dr. Benay Spice from the scheduler.  POF sent to scheduler with Dr. Gearldine Shown f/u appointment request.

## 2013-06-23 ENCOUNTER — Other Ambulatory Visit: Payer: Self-pay | Admitting: *Deleted

## 2013-06-24 ENCOUNTER — Ambulatory Visit: Payer: Medicare Other | Admitting: Oncology

## 2013-06-24 ENCOUNTER — Other Ambulatory Visit: Payer: Medicare Other

## 2013-06-24 ENCOUNTER — Telehealth: Payer: Self-pay | Admitting: Oncology

## 2013-06-24 NOTE — Telephone Encounter (Signed)
S/w the pt and she is aware of her r/s appts to march 2015.

## 2013-07-05 NOTE — Progress Notes (Signed)
Patient ID: Breanna Guerrero, female   DOB: 10/01/1927, 80 y.o.   MRN: [id number]               HISTORY & PHYSICAL  DATE: 05/05/2013     FACILITY: [facility]  LEVEL OF CARE: SNF (31)  ALLERGIES:  Allergies  Allergen Reactions  . Amiodarone     ? hallucinations  . Sulfa Antibiotics   . Latex Rash    CHIEF COMPLAINT:  Manage atrial fibrillation, anemia of chronic disease, and GERD.    HISTORY OF PRESENT ILLNESS:  The patient is an 80 year-old, Caucasian female who was hospitalized secondary to severe sepsis.  After hospitalization, she is admitted to this facility for short-term rehabilitation.  She has the following problems:    ATRIAL FIBRILLATION: the patients atrial fibrillation remains stable.  The patient denies DOE, tachycardia, orthopnea, transient neurological sx, pedal edema, palpitations, & PNDs.  No complications noted from the medications currently being used.     ANEMIA:  Patient's hemoglobin dropped to 6.7.  Therefore, she received 1 U of packed red blood cells.  The anemia has been stable. The patient denies fatigue, melena or hematochezia. No complications from the medications currently being used.  Discharge hemoglobin was 9.9.    GERD: pt's GERD is stable.  Denies ongoing heartburn, abd. Pain, nausea or vomiting.  Currently on a PPI & tolerates it without any adverse reactions.    PAST MEDICAL HISTORY :  Past Medical History  Diagnosis Date  . Hypertension   . Stroke May 2013    mild stroke  . Hemorrhoids   . Cancer     Rectal adenocarcinoma  . Atrial fibrillation 04/21/2013    perioperative    PAST SURGICAL HISTORY: Past Surgical History  Procedure Laterality Date  . Hemorrhoid surgery    . Colonoscopy    . Eus N/A 09/09/2012    Procedure: LOWER ENDOSCOPIC ULTRASOUND (EUS);  Surgeon: Arta Silence, MD;  Location: [facility];  Service: Endoscopy;  Laterality: N/A;  moderate sedation  . Rectal biopsy  09/03/2012    Suspicious  Adenocarcinoma    SOCIAL HISTORY:  reports that she has never smoked. She does not have any smokeless tobacco history on file. She reports that she does not drink alcohol or use illicit drugs.  FAMILY HISTORY:  Family History  Problem Relation Age of Onset  . Breast cancer Sister   . Brain cancer Sister   . Leukemia Maternal Aunt     CURRENT MEDICATIONS: Reviewed per MAR  REVIEW OF SYSTEMS:  See HPI otherwise 14 point ROS is negative.  PHYSICAL EXAMINATION  VS:  T 97.1       P 71      RR 18      BP 107/58      POX 95%        WT (Lb)  GENERAL: no acute distress, normal body habitus EYES: conjunctivae normal, sclerae normal, normal eye lids MOUTH/THROAT: lips without lesions,no lesions in the mouth,tongue is without lesions,uvula elevates in midline NECK: supple, trachea midline, no neck masses, no thyroid tenderness, no thyromegaly LYMPHATICS: no LAN in the neck, no supraclavicular LAN RESPIRATORY: breathing is even & unlabored, BS CTAB CARDIAC: heart rate is irregular irregular, no murmur,no extra heart sounds, no edema GI:  ABDOMEN: abdomen soft, normal BS, no masses, no tenderness, patient has left lower quadrant ileostomy bag   LIVER/SPLEEN: no hepatomegaly, no splenomegaly MUSCULOSKELETAL: HEAD: normal to inspection & palpation BACK: no  kyphosis, scoliosis or spinal processes tenderness EXTREMITIES: LEFT UPPER EXTREMITY: full range of motion, normal strength & tone RIGHT UPPER EXTREMITY:  full range of motion, normal strength & tone LEFT LOWER EXTREMITY:  full range of motion, normal strength & tone RIGHT LOWER EXTREMITY:  full range of motion, normal strength & tone PSYCHIATRIC: the patient is alert & oriented to person, affect & behavior appropriate  LABS/RADIOLOGY: CT of the abdomen and pelvis:  Incomplete fractures of the right side of the symphysis pubis and right sacral area.  No acute abnormalities.   Small bilateral pleural effusions.    Abdominal ultrasound:   Showed 6 mm gallstone in the fundus of the gallbladder.  Gallbladder wall is slightly thickened at 3.3 mm without pericholecystic fluid.    Chest x-ray:  No acute findings.      Sodium 126, potassium 5.6, chloride 94, CO2 15, BUN 61, creatinine 3.7, glucose 132.      AST 23, ALT 41, total bilirubin 1.1, calcium 8.6, alkaline phosphatase 250, total protein 6.3, albumin 2.4.    WBC 22.3, hemoglobin 9.9, MCV 82.    Lactic acid 2.8.    Troponin 0.03.    Urinalysis negative.    Labs reviewed: Basic Metabolic Panel:  Recent Labs  04/17/13 2107  04/25/13 0520 04/26/13 0600 04/27/13 0550  NA 126*  < > 139 135 133*  K 5.7*  < > 3.9 4.0 4.1  CL 100  < > 106 101 100  CO2 13*  < > 27 25 24   GLUCOSE 78  < > 111* 131* 100*  BUN 56*  < > 14 11 10   CREATININE 2.22*  < > 0.90 0.83 0.86  CALCIUM 8.0*  < > 8.2* 8.2* 8.2*  MG 1.6  --  1.5  --   --   PHOS 3.6  --   --   --   --   < > = values in this interval not displayed. Liver Function Tests:  Recent Labs  04/18/13 0503 04/18/13 1410 04/20/13 0956  AST 26 38* 36  ALT 28 29 38*  ALKPHOS 204* 198* 352*  BILITOT 1.7* 2.4* 3.7*  PROT 5.0* 4.9* 5.4*  ALBUMIN 1.9* 1.8* 1.8*   CBC:  Recent Labs  04/25/13 0520 04/26/13 0600 04/27/13 0550 04/28/13 0530 04/29/13 1700  WBC 7.2 8.2 5.3  --   --   NEUTROABS 6.1 7.0 4.1  --   --   HGB 7.0* 7.1* 6.7* 7.2* 8.0*  HCT 20.4* 21.0* 19.6* 21.3* 23.3*  MCV 81.3 83.0 81.7  --   --   PLT 138* 188 221  --   --    Cardiac Enzymes:  Recent Labs  04/18/13 1230 04/19/13 0100  TROPONINI <0.30 <0.30   CBG:  Recent Labs  04/22/13 0116  GLUCAP 140*  140*    ASSESSMENT/PLAN:  Atrial fibrillation.  Rate controlled.    Anemia of chronic disease.  Status post transfusion.  Reassess hemoglobin level.    GERD.  Well controlled.     Malnutrition.  Continue nutritional supplements.    Colon cancer.  Status post surgery and chemotherapy.  She is followed by the oncologist.     Hypokalemia.  Continue potassium supplementation.  Reassess potassium level.    Check CBC and BMP.    THN Metrics:   No tobacco.  No aspirin.    I have reviewed patient's medical records received at admission/from hospitalization.  CPT CODE: 20254

## 2013-07-06 ENCOUNTER — Encounter: Payer: Self-pay | Admitting: Internal Medicine

## 2013-07-06 DIAGNOSIS — E43 Unspecified severe protein-calorie malnutrition: Secondary | ICD-10-CM | POA: Insufficient documentation

## 2013-07-06 DIAGNOSIS — K219 Gastro-esophageal reflux disease without esophagitis: Secondary | ICD-10-CM | POA: Insufficient documentation

## 2013-07-06 DIAGNOSIS — D638 Anemia in other chronic diseases classified elsewhere: Secondary | ICD-10-CM | POA: Insufficient documentation

## 2013-07-20 ENCOUNTER — Telehealth: Payer: Self-pay | Admitting: Oncology

## 2013-07-20 NOTE — Telephone Encounter (Signed)
Called pt's daugfhter left message regarding appt for [facility] r/s due to MD's PAL, mailed new appt

## 2013-08-02 ENCOUNTER — Telehealth: Payer: Self-pay | Admitting: Oncology

## 2013-08-02 NOTE — Telephone Encounter (Signed)
Pt's daughter called and r/s appt to [facility] and MD, nurse notified

## 2013-08-09 ENCOUNTER — Ambulatory Visit: Payer: Medicare Other | Admitting: Oncology

## 2013-08-09 ENCOUNTER — Other Ambulatory Visit: Payer: Medicare Other

## 2013-08-17 ENCOUNTER — Ambulatory Visit: Payer: Medicare Other | Admitting: Oncology

## 2013-08-17 ENCOUNTER — Other Ambulatory Visit: Payer: Medicare Other

## 2013-08-20 ENCOUNTER — Telehealth: Payer: Self-pay | Admitting: *Deleted

## 2013-08-20 NOTE — Telephone Encounter (Signed)
Received call from patient's daughter requesting her mother's appointment be moved out 1 month due to having to go to [facility] for appointment same week.  She stated her mother was not having any problems.  Dr. Gearldine Shown nurse notified.

## 2013-09-02 ENCOUNTER — Other Ambulatory Visit (HOSPITAL_BASED_OUTPATIENT_CLINIC_OR_DEPARTMENT_OTHER): Payer: Medicare Other

## 2013-09-02 ENCOUNTER — Ambulatory Visit (HOSPITAL_BASED_OUTPATIENT_CLINIC_OR_DEPARTMENT_OTHER): Payer: Medicare Other | Admitting: Oncology

## 2013-09-02 ENCOUNTER — Ambulatory Visit: Payer: Medicare Other | Admitting: Oncology

## 2013-09-02 VITALS — BP 156/72 | HR 67 | Temp 98.0°F | Resp 20 | Ht 62.0 in | Wt 113.4 lb

## 2013-09-02 DIAGNOSIS — D649 Anemia, unspecified: Secondary | ICD-10-CM

## 2013-09-02 DIAGNOSIS — D638 Anemia in other chronic diseases classified elsewhere: Secondary | ICD-10-CM

## 2013-09-02 DIAGNOSIS — C20 Malignant neoplasm of rectum: Secondary | ICD-10-CM

## 2013-09-02 LAB — CBC WITH DIFFERENTIAL/PLATELET
BASO%: 0.4 % (ref 0.0–2.0)
BASOS ABS: 0 10*3/uL (ref 0.0–0.1)
EOS ABS: 0.1 10*3/uL (ref 0.0–0.5)
EOS%: 1.2 % (ref 0.0–7.0)
HEMATOCRIT: 31.9 % — AB (ref 34.8–46.6)
HGB: 10.7 g/dL — ABNORMAL LOW (ref 11.6–15.9)
LYMPH#: 1.3 10*3/uL (ref 0.9–3.3)
LYMPH%: 18 % (ref 14.0–49.7)
MCH: 28.7 pg (ref 25.1–34.0)
MCHC: 33.5 g/dL (ref 31.5–36.0)
MCV: 85.8 fL (ref 79.5–101.0)
MONO#: 0.6 10*3/uL (ref 0.1–0.9)
MONO%: 8.5 % (ref 0.0–14.0)
NEUT%: 71.9 % (ref 38.4–76.8)
NEUTROS ABS: 5.4 10*3/uL (ref 1.5–6.5)
Platelets: 253 10*3/uL (ref 145–400)
RBC: 3.72 10*6/uL (ref 3.70–5.45)
RDW: 16.2 % — AB (ref 11.2–14.5)
WBC: 7.5 10*3/uL (ref 3.9–10.3)

## 2013-09-02 LAB — CEA: CEA: <0.5 ng/mL (ref 0.0–5.0)

## 2013-09-02 NOTE — Progress Notes (Signed)
  [place] OFFICE PROGRESS NOTE   Diagnosis: rectal cancer  INTERVAL HISTORY:   Ms. Glaus was admitted in November with sepsisin November of 2014.she underwent loop ileostomy closure to 07/07/2013. She now feels well. She complains of discomfort from hemorrhoids. No bleeding. She is followed by Dr. Charma Igo.  Objective:  Vital signs in last 24 hours:  Blood pressure 156/72, pulse 67, temperature 98 F (36.7 C), temperature source Oral, resp. rate 20, height 5\' 2"  (1.575 m), weight 113 lb 6.4 oz (51.438 kg).    HEENT: neck without mass Lymphatics: no cervical, supra-clavicular, axillary, or inguinal nodes Resp: lungs clear bilaterally Cardio: regular rate and rhythm GI: no hepatosplenomegaly, nontender, no mass, healed surgical incision at the right lower abdomen ileostomy site Vascular: no leg edema   Lab Results:  Lab Results  Component Value Date   WBC 7.5 09/02/2013   HGB 10.7* 09/02/2013   HCT 31.9* 09/02/2013   MCV 85.8 09/02/2013   PLT 253 09/02/2013   NEUTROABS 5.4 09/02/2013    Lab Results  Component Value Date   CEA <0.5 09/02/2013    Imaging:  No results found.  Medications: I have reviewed the patient's current medications.  Assessment/Plan: 1. Rectal cancer. Mid to distal rectal mass status post endoscopic biopsy 09/03/2012 with pathology confirming fragments of a tubulovillous adenoma with a small focus suspicious for adenocarcinoma; staging EUS T3 N1. She began radiation and Xeloda 09/22/2012, completed 10/30/2012 Low anterior resection/diverting ileostomy 01/07/2013 confirming a pT3,pN0 tumor with negative surgical margins, 21 negative lymph nodes  Ileostomy closure02/09/2013 2. Constipation/rectal bleeding secondary to #1. She is no longer experiencing rectal bleeding. 3. History of a CVA May 2013 with no residual deficit. 4. discomfort at the right thigh with a question of right leg enlargement-she will be referred for a Doppler of the right leg  today-negative bilateral lower extremity Doppler 04/19/2013 5. Admission with sepsis syndrome November 2014 6. Anemia-likely secondary to chronic disease, improved compared to hospital discharge November 2014  Disposition:  Ms. Betzler is in clinical remission from rectal cancer. She will return for an office visit and CEA in 6 months. She plans to schedule a surveillance colonoscopy with Dr. Oletta Lamas.  Betsy Coder, MD  09/02/2013  7:39 PM

## 2013-09-03 ENCOUNTER — Telehealth: Payer: Self-pay | Admitting: Oncology

## 2013-09-03 ENCOUNTER — Telehealth: Payer: Self-pay | Admitting: *Deleted

## 2013-09-03 NOTE — Telephone Encounter (Signed)
Message copied by Norma Fredrickson on Fri Sep 03, 2013 10:41 AM ------      Message from: Tania Ade      Created: Fri Sep 03, 2013 10:24 AM                   ----- Message -----         From: [name], MD         Sent: 09/02/2013   7:35 PM           To: Tania Ade, RN, Ludwig Lean, RN, #            Please call patient, cea is normal ------

## 2013-09-03 NOTE — Telephone Encounter (Signed)
Called and informed patient's daughter Lattie Haw) of normal cea.  Per Dr. Benay Spice. Patient's daughter verbalized understanding and stated she would inform patient.

## 2013-09-03 NOTE — Telephone Encounter (Signed)
lvm for pt regarding to Oct appt...mailed pt appt sched/avs and letter

## 2013-09-10 ENCOUNTER — Telehealth: Payer: Self-pay | Admitting: Oncology

## 2013-09-10 NOTE — Telephone Encounter (Signed)
pt called to r/s appt to diff d.t. ...done...pt aware of new d.t

## 2014-03-07 ENCOUNTER — Other Ambulatory Visit: Payer: Medicare Other

## 2014-03-07 ENCOUNTER — Ambulatory Visit: Payer: Medicare Other | Admitting: Oncology

## 2014-03-15 ENCOUNTER — Other Ambulatory Visit (HOSPITAL_BASED_OUTPATIENT_CLINIC_OR_DEPARTMENT_OTHER): Payer: Medicare Other

## 2014-03-15 ENCOUNTER — Telehealth: Payer: Self-pay | Admitting: Oncology

## 2014-03-15 ENCOUNTER — Ambulatory Visit (HOSPITAL_BASED_OUTPATIENT_CLINIC_OR_DEPARTMENT_OTHER): Payer: Medicare Other | Admitting: Oncology

## 2014-03-15 VITALS — BP 138/72 | HR 62 | Temp 98.3°F | Resp 18 | Ht 62.0 in | Wt 115.7 lb

## 2014-03-15 DIAGNOSIS — D638 Anemia in other chronic diseases classified elsewhere: Secondary | ICD-10-CM

## 2014-03-15 DIAGNOSIS — C20 Malignant neoplasm of rectum: Secondary | ICD-10-CM

## 2014-03-15 DIAGNOSIS — N189 Chronic kidney disease, unspecified: Secondary | ICD-10-CM

## 2014-03-15 LAB — CBC WITH DIFFERENTIAL/PLATELET
BASO%: 0.3 % (ref 0.0–2.0)
Basophils Absolute: 0 10*3/uL (ref 0.0–0.1)
EOS ABS: 0.1 10*3/uL (ref 0.0–0.5)
EOS%: 0.9 % (ref 0.0–7.0)
HCT: 36.2 % (ref 34.8–46.6)
HGB: 12.3 g/dL (ref 11.6–15.9)
LYMPH%: 19.9 % (ref 14.0–49.7)
MCH: 29.4 pg (ref 25.1–34.0)
MCHC: 34 g/dL (ref 31.5–36.0)
MCV: 86.6 fL (ref 79.5–101.0)
MONO#: 0.8 10*3/uL (ref 0.1–0.9)
MONO%: 12.8 % (ref 0.0–14.0)
NEUT%: 66.1 % (ref 38.4–76.8)
NEUTROS ABS: 4.3 10*3/uL (ref 1.5–6.5)
PLATELETS: 196 10*3/uL (ref 145–400)
RBC: 4.18 10*6/uL (ref 3.70–5.45)
RDW: 14.8 % — ABNORMAL HIGH (ref 11.2–14.5)
WBC: 6.5 10*3/uL (ref 3.9–10.3)
lymph#: 1.3 10*3/uL (ref 0.9–3.3)

## 2014-03-15 NOTE — Progress Notes (Signed)
  [place] OFFICE PROGRESS NOTE   Diagnosis: Rectal cancer  INTERVAL HISTORY:   Breanna Guerrero returns as scheduled. She feels well. Occasional diarrhea. No bleeding. She has an abdominal hernia. She is scheduled to see Dr. Oletta Lamas. She saw Dr. Charma Igo in March.  Objective:  Vital signs in last 24 hours:  Blood pressure 138/72, pulse 62, temperature 98.3 F (36.8 C), temperature source Oral, resp. rate 18, height 5\' 2"  (1.575 m), weight 115 lb 11.2 oz (52.481 kg), SpO2 98.00%.    HEENT: Neck without mass Lymphatics: No cervical, supraclavicular, axillary, or right inguinal nodes.? Pea-sized left inguinal node Resp: Lungs clear bilaterally Cardio: Regular rate and rhythm GI: No hepatomegaly, no mass. Reducible ventral hernia. Nontender. Vascular: No leg edema.   Lab Results:  Lab Results  Component Value Date   WBC 6.5 03/15/2014   HGB 12.3 03/15/2014   HCT 36.2 03/15/2014   MCV 86.6 03/15/2014   PLT 196 03/15/2014   NEUTROABS 4.3 03/15/2014      Lab Results  Component Value Date   CEA <0.5 09/02/2013   Medications: I have reviewed the patient's current medications.  Assessment/Plan: 1. Rectal cancer. Mid to distal rectal mass status post endoscopic biopsy 09/03/2012 with pathology confirming fragments of a tubulovillous adenoma with a small focus suspicious for adenocarcinoma; staging EUS T3 N1. She began radiation and Xeloda 09/22/2012, completed 10/30/2012 Low anterior resection/diverting ileostomy 01/07/2013 confirming a pT3,pN0 tumor with negative surgical margins, 21 negative lymph nodes  Ileostomy closure 07/07/2013 2. Constipation/rectal bleeding secondary to #1. She is no longer experiencing rectal bleeding. 3. History of a CVA May 2013 with no residual deficit. 4. discomfort at the right thigh with a question of right leg enlargement--negative bilateral lower extremity Doppler 04/19/2013 5. Admission with sepsis syndrome November  2014 6. History of Anemia-likely secondary to chronic disease-resolved    Disposition:  Breanna Guerrero remains in clinical remission from rectal cancer. She will see Dr. Oletta Lamas to discuss the indication for a surveillance colonoscopy. We will followup on the CEA from today. Breanna Guerrero will return for an office visit and CEA in 6 months.  Betsy Coder, MD  03/15/2014  11:23 AM

## 2014-03-15 NOTE — Telephone Encounter (Signed)
Pt confirmed labs/ov per 10/13 POF, gave pt AVS.... KJ °

## 2014-03-16 ENCOUNTER — Telehealth: Payer: Self-pay | Admitting: *Deleted

## 2014-03-16 LAB — CEA: CEA: 0.6 ng/mL (ref 0.0–5.0)

## 2014-03-16 NOTE — Telephone Encounter (Signed)
Message copied by Tania Ade on Wed Mar 16, 2014  1:38 PM ------      Message from: Betsy Coder B      Created: Wed Mar 16, 2014  1:14 PM       Please call daughter, cea is normal ------

## 2014-03-16 NOTE — Telephone Encounter (Signed)
Daughter, Lattie Haw notified of normal CEA

## 2014-10-25 ENCOUNTER — Other Ambulatory Visit: Payer: Federal, State, Local not specified - PPO

## 2014-10-25 ENCOUNTER — Telehealth: Payer: Self-pay | Admitting: Oncology

## 2014-10-25 ENCOUNTER — Ambulatory Visit (HOSPITAL_BASED_OUTPATIENT_CLINIC_OR_DEPARTMENT_OTHER): Payer: Medicare Other | Admitting: Oncology

## 2014-10-25 VITALS — BP 139/71 | HR 65 | Temp 98.1°F | Resp 18 | Ht 62.0 in | Wt 121.6 lb

## 2014-10-25 DIAGNOSIS — C20 Malignant neoplasm of rectum: Secondary | ICD-10-CM

## 2014-10-25 NOTE — Telephone Encounter (Signed)
per pof to shc pt appt-gave pt copy of [place]

## 2014-10-25 NOTE — Telephone Encounter (Signed)
per pof ot sch pt appt-gave pt copy of sch °

## 2014-10-25 NOTE — Progress Notes (Signed)
  [place] OFFICE PROGRESS NOTE   Diagnosis: Rectal cancer  INTERVAL HISTORY:   Ms. Noyes returns as scheduled. She feels well. She is working part-time. She has a incisional hernia. This is not painful. She saw Dr.Bohl in March. A rectal exam was unremarkable.  Objective:  Vital signs in last 24 hours:  Blood pressure 139/71, pulse 65, temperature 98.1 F (36.7 C), temperature source Oral, resp. rate 18, height 5\' 2"  (1.575 m), weight 121 lb 9.6 oz (55.157 kg), SpO2 98 %.    HEENT: Neck without mass Lymphatics: No cervical, supra-clavicular, axillary, or inguinal nodes Resp: Lungs clear bilaterally Cardio: Regular rate and rhythm GI: No hepatomegaly, reducible incisional hernia, no mass, mild tenderness in the low abdomen bilaterally Vascular: No leg edema   Lab Results  Component Value Date   CEA 0.6 03/15/2014    Medications: I have reviewed the patient's current medications.  Assessment/Plan: 1. Rectal cancer. Mid to distal rectal mass status post endoscopic biopsy 09/03/2012 with pathology confirming fragments of a tubulovillous adenoma with a small focus suspicious for adenocarcinoma; staging EUS T3 N1. She began radiation and Xeloda 09/22/2012, completed 10/30/2012  Low anterior resection/diverting ileostomy 01/07/2013 confirming a pT3,pN0 tumor with negative surgical margins, 21 negative lymph nodes   Ileostomy closure 07/07/2013 2. Constipation/rectal bleeding secondary to #1. She is no longer experiencing rectal bleeding. 3. History of a CVA May 2013 with no residual deficit. 4. discomfort at the right thigh with a question of right leg enlargement--negative bilateral lower extremity Doppler 04/19/2013 5. Admission with sepsis syndrome November 2014 6. History of Anemia-likely secondary to chronic disease-resolved 7. Incisional hernia   Disposition:  Ms. Basley remains in clinical remission from rectal cancer. We will follow-up on the CEA  from today. She will return for an office visit and CEA in approximately 8 months.  Betsy Coder, MD  10/25/2014  11:21 AM

## 2014-10-26 ENCOUNTER — Telehealth: Payer: Self-pay | Admitting: *Deleted

## 2014-10-26 LAB — CEA: CEA: 0.7 ng/mL (ref 0.0–5.0)

## 2014-10-26 NOTE — Telephone Encounter (Signed)
-----   Message from [name], MD sent at 10/26/2014  3:35 PM EDT ----- Please call patient, cea is normal

## 2014-11-08 ENCOUNTER — Telehealth: Payer: Self-pay | Admitting: *Deleted

## 2014-11-08 NOTE — Telephone Encounter (Signed)
-----   Message from [name], MD sent at 10/26/2014  3:35 PM EDT ----- Please call patient, Breanna Guerrero is normal

## 2014-11-08 NOTE — Telephone Encounter (Signed)
Per Dr. Sherrill; notified pt that cea is normal.  Pt verbalized understanding. 

## 2015-06-27 ENCOUNTER — Ambulatory Visit (HOSPITAL_BASED_OUTPATIENT_CLINIC_OR_DEPARTMENT_OTHER): Payer: Medicare Other | Admitting: Oncology

## 2015-06-27 ENCOUNTER — Telehealth: Payer: Self-pay | Admitting: Oncology

## 2015-06-27 ENCOUNTER — Other Ambulatory Visit (HOSPITAL_BASED_OUTPATIENT_CLINIC_OR_DEPARTMENT_OTHER): Payer: Federal, State, Local not specified - PPO

## 2015-06-27 VITALS — BP 132/76 | HR 79 | Temp 98.3°F | Resp 18 | Ht 62.0 in | Wt 121.5 lb

## 2015-06-27 DIAGNOSIS — C20 Malignant neoplasm of rectum: Secondary | ICD-10-CM | POA: Diagnosis not present

## 2015-06-27 NOTE — Progress Notes (Signed)
  [place] OFFICE PROGRESS NOTE   Diagnosis: Rectal cancer  INTERVAL HISTORY:   Ms. [name] returns as scheduled. She feels well. She is bothered by the ventral hernia. No other complaint. She has decided against further colonoscopies.  Objective:  Vital signs in last 24 hours:  Blood pressure 132/76, pulse 79, temperature 98.3 F (36.8 C), temperature source Oral, resp. rate 18, height 5\' 2"  (1.575 m), weight 121 lb 8 oz (55.112 kg), SpO2 98 %.    HEENT: Neck without mass Lymphatics: No cervical, supraclavicular, axillary, or inguinal nodes Resp: Lungs clear bilaterally Cardio: Regular rate and rhythm GI: No hepatomegaly, no mass, reducible midline incisional hernia Vascular: No leg edema   Lab Results:   Lab Results  Component Value Date   CEA 0.7 10/25/2014   Medications: I have reviewed the patient's current medications.  Assessment/Plan: 1. Rectal cancer. Mid to distal rectal mass status post endoscopic biopsy 09/03/2012 with pathology confirming fragments of a tubulovillous adenoma with a small focus suspicious for adenocarcinoma; staging EUS T3 N1. She began radiation and Xeloda 09/22/2012, completed 10/30/2012  Low anterior resection/diverting ileostomy 01/07/2013 confirming a pT3,pN0 tumor with negative surgical margins, 21 negative lymph nodes   Ileostomy closure 07/07/2013 2. Constipation/rectal bleeding secondary to #1. She is no longer experiencing rectal bleeding. 3. History of a CVA May 2013 with no residual deficit. 4. discomfort at the right thigh with a question of right leg enlargement--negative bilateral lower extremity Doppler 04/19/2013 5. Admission with sepsis syndrome November 2014 6. History of Anemia-likely secondary to chronic disease-resolved 7. Incisional hernia   Disposition:  Breanna Guerrero remains in clinical remission from rectal cancer. We will follow-up on the CEA from today. She is now almost 3 years out from diagnosis.  She is scheduled to see Dr. Morton Stall in March. She will return for an office visit and CEA in one year.  Betsy Coder, MD  06/27/2015  11:54 AM

## 2015-06-27 NOTE — Telephone Encounter (Signed)
Pt confirmed labs/ov per 01/24 POF, gave pt AVS and Calendar... KJ °

## 2015-06-28 ENCOUNTER — Telehealth: Payer: Self-pay | Admitting: *Deleted

## 2015-06-28 LAB — CEA: CEA: 1.4 ng/mL (ref 0.0–4.7)

## 2015-06-28 LAB — CEA (PARALLEL TESTING): CEA: 0.7 ng/mL (ref 0.0–5.0)

## 2015-06-28 NOTE — Telephone Encounter (Signed)
Per Dr. Sherrill; left voice message that cea is normal; call office if questions. 

## 2015-06-28 NOTE — Telephone Encounter (Signed)
-----   Message from [name], MD sent at 06/28/2015  6:42 AM EST ----- Please call patient, Breanna Guerrero is normal

## 2015-09-04 IMAGING — CT CT ABD-PELV W/O CM
2 of 4 series · 16 of 46 positions shown, 18 images · non-contrast
Comparison: CT scan dated 09/04/2012

CLINICAL DATA: Sepsis with rectal mucinous adenocarcinoma resected
in January 2013.

EXAM:
CT ABDOMEN AND PELVIS WITHOUT CONTRAST
TECHNIQUE: Multidetector CT imaging of the abdomen and pelvis was performed
following the standard protocol without intravenous contrast.

[Series 2: abd/ pelvis 5.0 i30f 1 · axial · 0.76mm/px · z∈[-454,-108]mm · 13 of 78 slices shown, 15 images]
[im 6/78  soft-tissue]
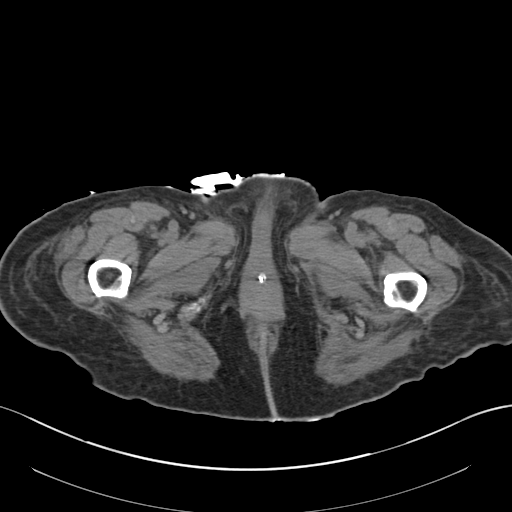
[im 6/78  bone]
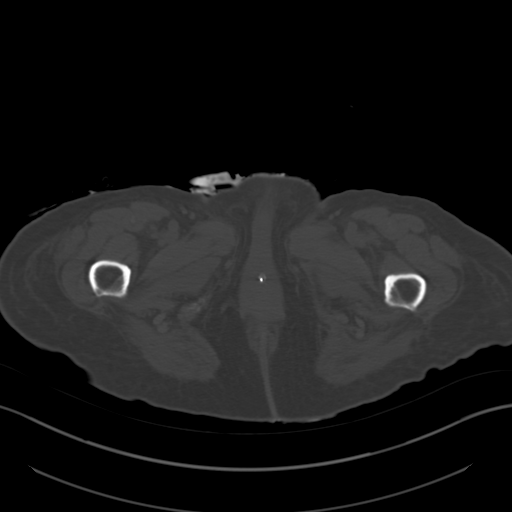
[im 12/78  soft-tissue]
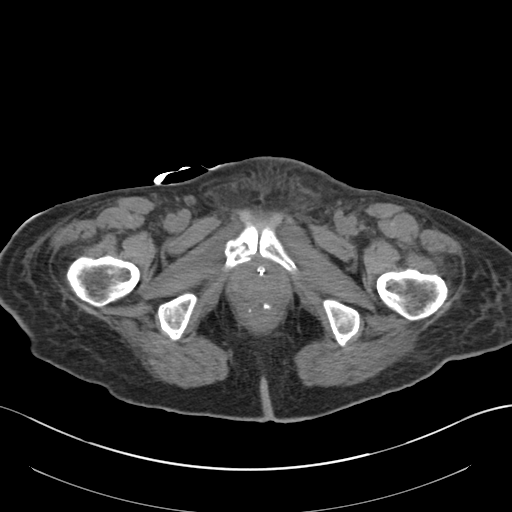
[im 18/78  soft-tissue]
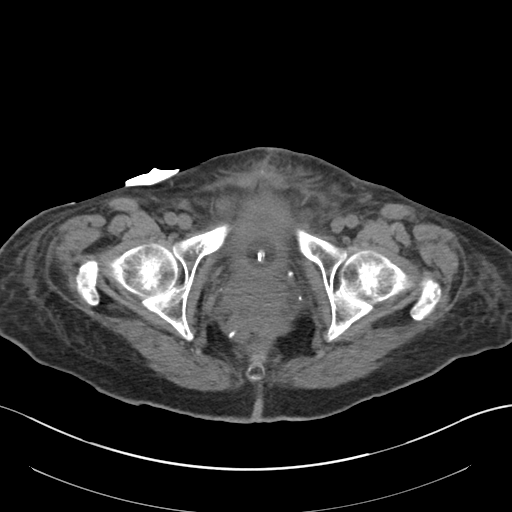
[im 23/78  soft-tissue]
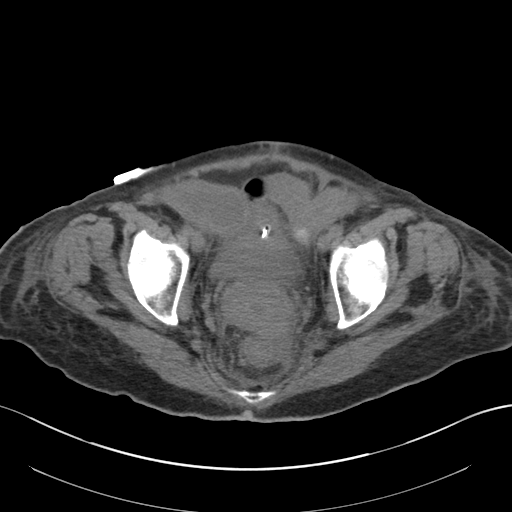
[im 29/78  soft-tissue]
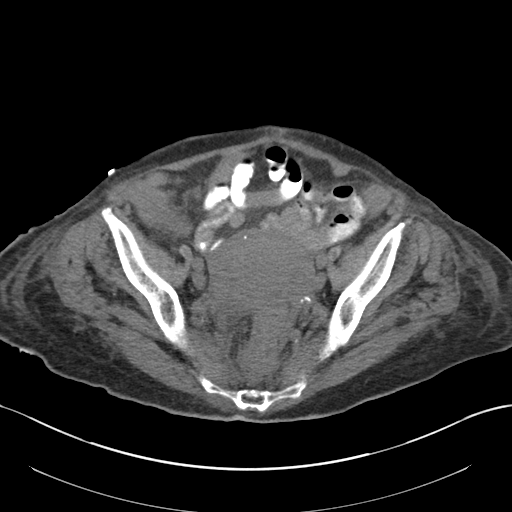
[im 35/78  soft-tissue]
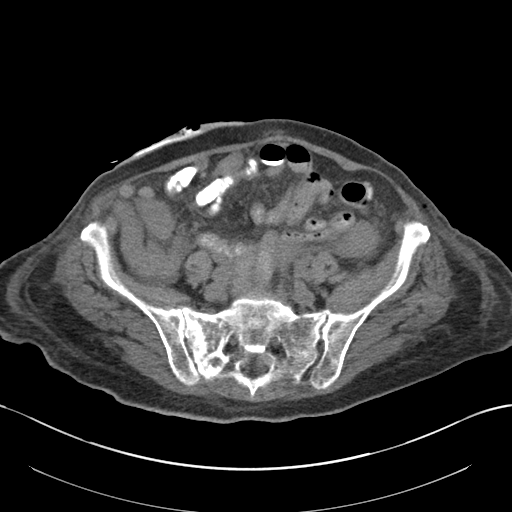
[im 40/78  soft-tissue]
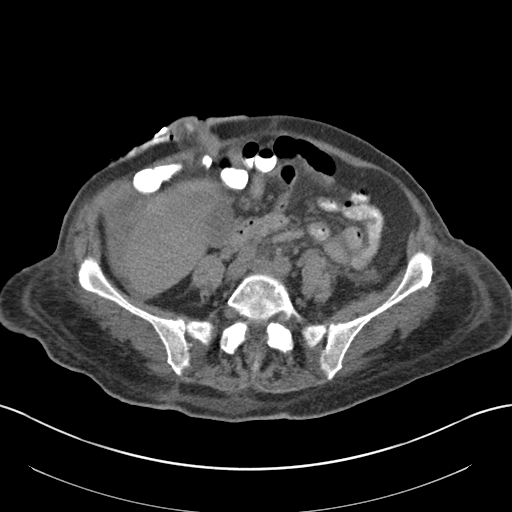
[im 46/78  soft-tissue]
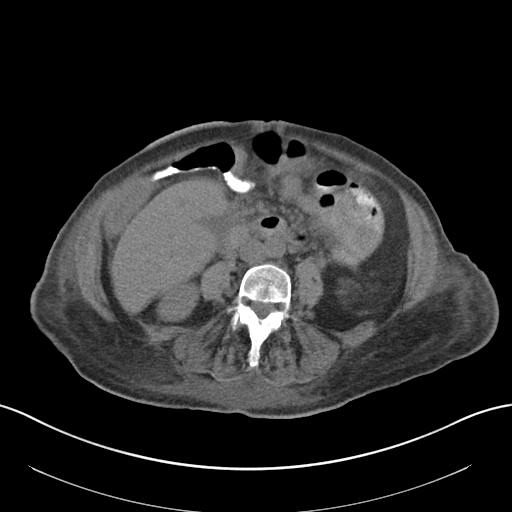
[im 52/78  soft-tissue]
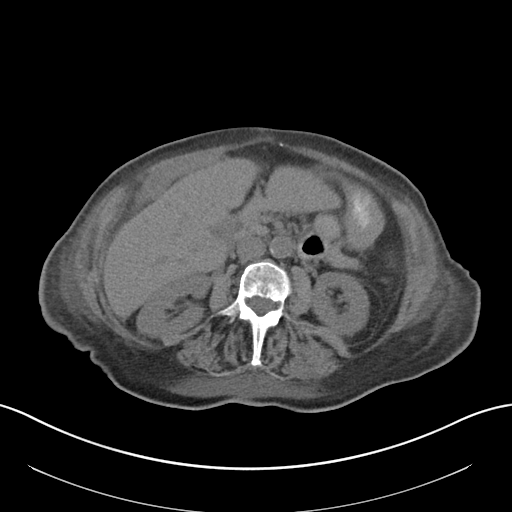
[im 52/78  bone]
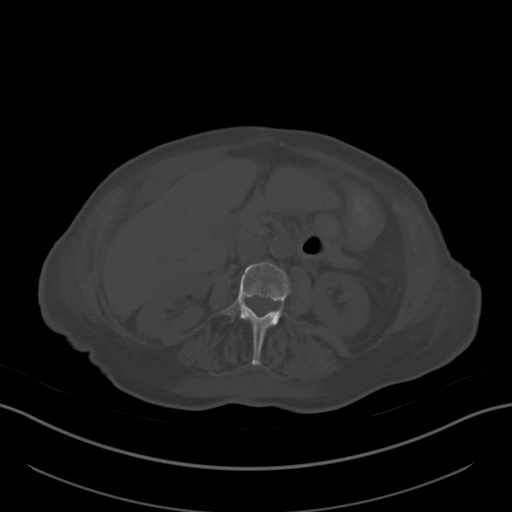
[im 58/78  soft-tissue]
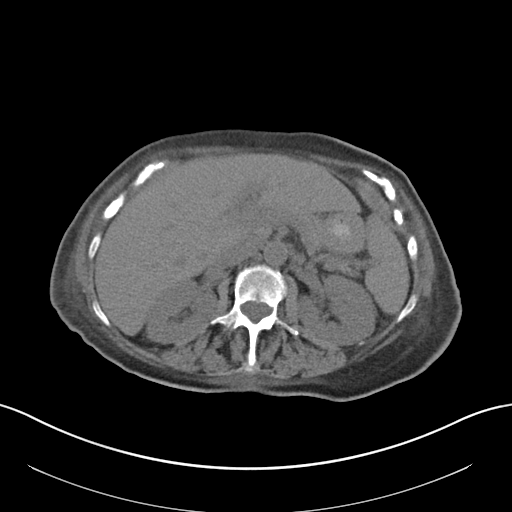
[im 63/78  soft-tissue]
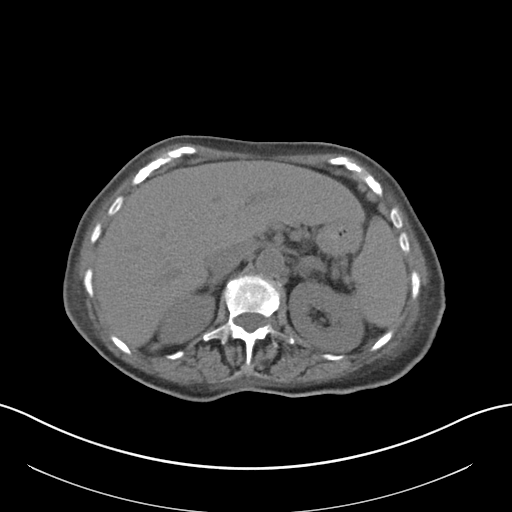
[im 69/78  soft-tissue]
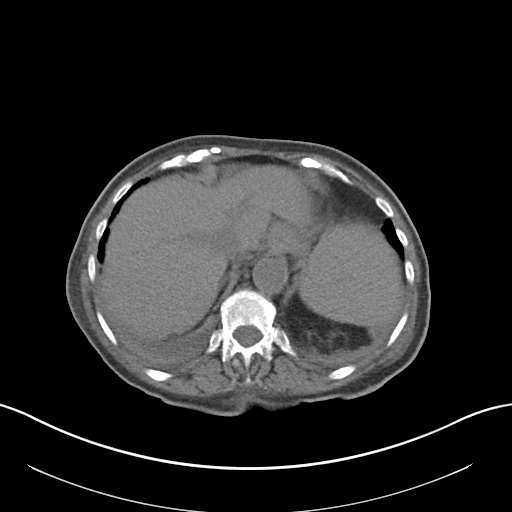
[im 75/78  soft-tissue]
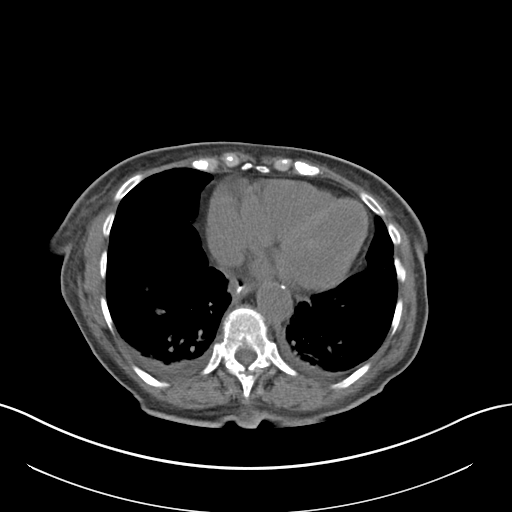

[Series 5: cor st · coronal · 0.76mm/px · 3 of 76 slices shown]
[im 26/76  soft-tissue]
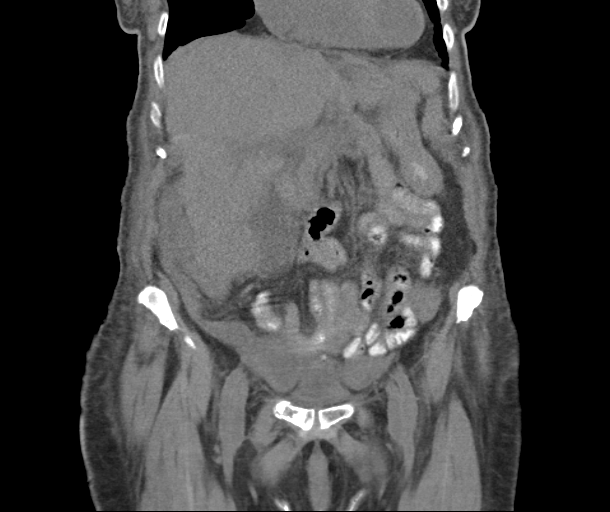
[im 34/76  soft-tissue]
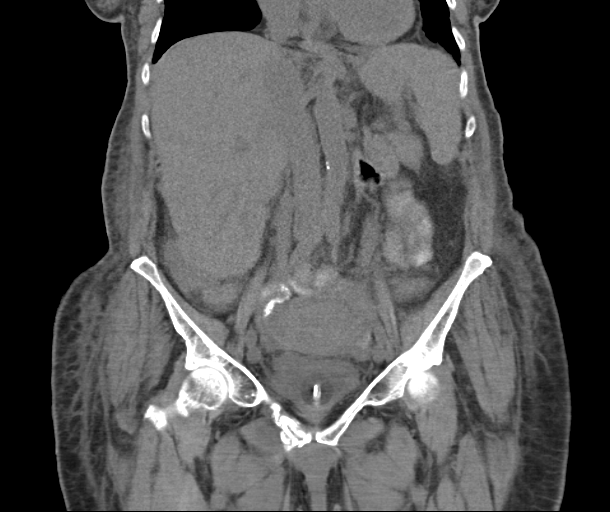
[im 42/76  soft-tissue]
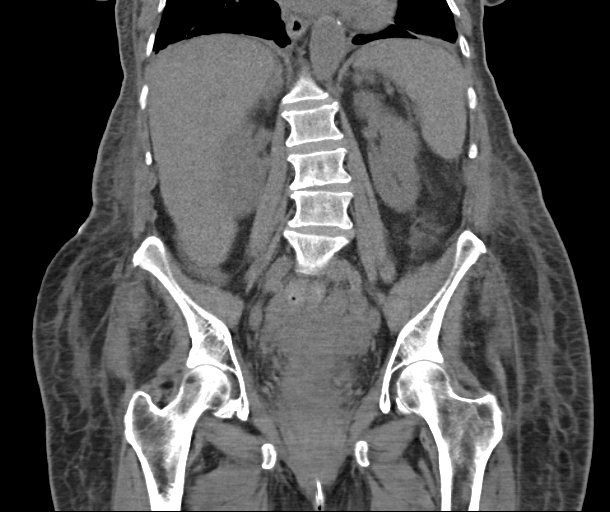

[16 of 46 positions shown; findings below may reference images not displayed]

FINDINGS: There are tiny bilateral pleural effusions with bibasilar
atelectasis and marked peribronchial thickening in the lower lobes.

The unenhanced liver, spleen, pancreas, adrenal glands, and kidneys
are normal except for a tiny stone in the upper pole of the right
kidney. Ileostomy is noted in the right mid abdomen. There are no
dilated loops of bowel. Uterus is normal. Right ovary is normal.
Left ovary is not well-defined.

Postsurgical changes in the presacral space. No appreciable
abdominal abscesses. Slight anasarca in the subcutaneous fat of the
abdomen and pelvis.

The patient has fractures of the right side of the sacrum and of the
right side of the symphysis pubis which are new since 09/04/2012 but
these are not acute. There are incompletely healed.
IMPRESSION: 1. Incompletely healed fractures of the right side of the symphysis
pubis and right sacral ala.
2. Postsurgical changes in lower pelvis. No acute abnormality in the
abdomen.
3. Small bilateral pleural effusions with bibasilar atelectasis and
peribronchial thickening.
4. Slight anasarca.

## 2016-07-08 ENCOUNTER — Telehealth: Payer: Self-pay | Admitting: *Deleted

## 2016-07-08 ENCOUNTER — Telehealth: Payer: Self-pay | Admitting: Oncology

## 2016-07-08 NOTE — Telephone Encounter (Signed)
Patient's daughter called to reschedule appointments. Patient to have appointment on same day as appointment with Dr. Morton Stall.

## 2016-07-08 NOTE — Telephone Encounter (Signed)
Message from pt's daughter to reschedule appts. Noted she was r/s to 08/20/16.

## 2016-07-09 ENCOUNTER — Ambulatory Visit: Payer: Medicare Other | Admitting: Oncology

## 2016-07-09 ENCOUNTER — Other Ambulatory Visit: Payer: Medicare Other

## 2016-08-19 ENCOUNTER — Other Ambulatory Visit: Payer: Self-pay | Admitting: *Deleted

## 2016-08-19 DIAGNOSIS — C20 Malignant neoplasm of rectum: Secondary | ICD-10-CM

## 2016-08-20 ENCOUNTER — Ambulatory Visit (HOSPITAL_BASED_OUTPATIENT_CLINIC_OR_DEPARTMENT_OTHER): Payer: Medicare Other | Admitting: Oncology

## 2016-08-20 ENCOUNTER — Telehealth: Payer: Self-pay | Admitting: Oncology

## 2016-08-20 ENCOUNTER — Other Ambulatory Visit (HOSPITAL_BASED_OUTPATIENT_CLINIC_OR_DEPARTMENT_OTHER): Payer: Medicare Other

## 2016-08-20 VITALS — BP 144/53 | HR 84 | Temp 98.3°F | Resp 18 | Ht 62.0 in | Wt 125.4 lb

## 2016-08-20 DIAGNOSIS — C20 Malignant neoplasm of rectum: Secondary | ICD-10-CM

## 2016-08-20 NOTE — Progress Notes (Signed)
  [place] OFFICE PROGRESS NOTE   Diagnosis: Rectal cancer  INTERVAL HISTORY:   Breanna Guerrero returns as scheduled. She is currently being treated for a urinary tract infection. She saw Dr. Morton Stall earlier today. She reports she has been discharged from the surgical clinic. She feels well. Good appetite. No difficulty with bowel function.  Objective:  Vital signs in last 24 hours:  Blood pressure (!) 144/53, pulse 84, temperature 98.3 F (36.8 C), temperature source Oral, resp. rate 18, height 5\' 2"  (1.575 m), weight 125 lb 6.4 oz (56.9 kg), SpO2 97 %.    HEENT: Neck without mass Lymphatics: No cervical, supraclavicular, axillary, or inguinal nodes Resp: Lungs clear bilaterally Cardio: Regular rhythm with premature beats GI: No hepatosplenomegaly, no mass, nontender, reducible incisional hernia Vascular: No leg edema   Lab Results:  CEA-pending  Medications: I have reviewed the patient's current medications.  Assessment/Plan: 1. Rectal cancer. Mid to distal rectal mass status post endoscopic biopsy 09/03/2012 with pathology confirming fragments of a tubulovillous adenoma with a small focus suspicious for adenocarcinoma; staging EUS T3 N1. She began radiation and Xeloda 09/22/2012, completed 10/30/2012  Low anterior resection/diverting ileostomy 01/07/2013 confirming a pT3,pN0 tumor with negative surgical margins, 21 negative lymph nodes   Ileostomy closure 07/07/2013 2. Constipation/rectal bleeding secondary to #1. She is no longer experiencing rectal bleeding. 3. History of a CVA May 2013 with no residual deficit. 4. discomfort at the right thigh with a question of right leg enlargement--negative bilateral lower extremity Doppler 04/19/2013 5. Admission with sepsis syndrome November 2014 6. History of Anemia-likely secondary to chronic disease-resolved 7. Incisional hernia     Disposition:  Breanna Guerrero remains in clinical remission from rectal cancer.  We will follow-up on the CEA from today. She would like to continue follow-up at the [facility] She will return for an office visit and CEA in one year. She has decided against a surveillance colonoscopy. 15 minutes were spent with the patient today. The majority of the time was used for counseling and coordination of care.  Betsy Coder, MD  08/20/2016  4:11 PM

## 2016-08-20 NOTE — Telephone Encounter (Signed)
Gave patient AVS and calender per 08/20/2016 los 

## 2016-08-21 LAB — CEA (IN HOUSE-CHCC): CEA (CHCC-In House): 1.81 ng/mL (ref 0.00–5.00)

## 2016-08-21 LAB — CEA: CEA: 1.7 ng/mL (ref 0.0–4.7)

## 2016-08-22 ENCOUNTER — Telehealth: Payer: Self-pay | Admitting: *Deleted

## 2016-08-22 NOTE — Telephone Encounter (Signed)
Message left on patient's daughter, Lattie Haw private cell phone to inform her per order of Dr. Benay Spice that cea is normal and to f/u as scheduled. Instructed Lattie Haw to call [place] back with any questions or concerns.

## 2016-08-22 NOTE — Telephone Encounter (Signed)
-----   Message from [name], MD sent at 08/21/2016  3:29 PM EDT ----- Please call patient, Breanna Guerrero is normal, f/u as scheduled

## 2017-08-21 ENCOUNTER — Ambulatory Visit: Payer: Medicare Other | Admitting: Oncology

## 2017-08-21 ENCOUNTER — Other Ambulatory Visit: Payer: Medicare Other

## 2017-08-29 ENCOUNTER — Other Ambulatory Visit: Payer: Medicare Other

## 2017-08-29 ENCOUNTER — Ambulatory Visit: Payer: Medicare Other | Admitting: Oncology

## 2017-08-29 ENCOUNTER — Telehealth: Payer: Self-pay

## 2017-08-29 NOTE — Telephone Encounter (Signed)
Pt family member will have pt return call regarding missed appt/

## 2022-11-26 ENCOUNTER — Other Ambulatory Visit: Payer: Self-pay | Admitting: Optometry

## 2022-11-26 DIAGNOSIS — G459 Transient cerebral ischemic attack, unspecified: Secondary | ICD-10-CM
